# Patient Record
Sex: Male | Born: 1960
Health system: Southern US, Community
[De-identification: ages and names within clinical notes are randomized; demographics above are authoritative.]

## PROBLEM LIST (undated history)

## (undated) DIAGNOSIS — D649 Anemia, unspecified: Secondary | ICD-10-CM

## (undated) DIAGNOSIS — I639 Cerebral infarction, unspecified: Secondary | ICD-10-CM

## (undated) DIAGNOSIS — E785 Hyperlipidemia, unspecified: Secondary | ICD-10-CM

## (undated) DIAGNOSIS — I10 Essential (primary) hypertension: Secondary | ICD-10-CM

## (undated) HISTORY — DX: Essential (primary) hypertension: I10

## (undated) HISTORY — DX: Hyperlipidemia, unspecified: E78.5

## (undated) HISTORY — DX: Cerebral infarction, unspecified: I63.9

## (undated) HISTORY — PX: TUMOR EXCISION: SHX421

## (undated) HISTORY — DX: Anemia, unspecified: D64.9

---

## 2008-12-05 ENCOUNTER — Emergency Department (HOSPITAL_COMMUNITY): Admission: EM | Admit: 2008-12-05 | Discharge: 2008-12-05 | Payer: Self-pay | Admitting: Emergency Medicine

## 2013-07-08 ENCOUNTER — Ambulatory Visit: Payer: Self-pay

## 2013-08-04 ENCOUNTER — Ambulatory Visit: Payer: Self-pay | Admitting: Internal Medicine

## 2014-02-22 ENCOUNTER — Emergency Department (HOSPITAL_COMMUNITY)
Admission: EM | Admit: 2014-02-22 | Discharge: 2014-02-22 | Disposition: A | Discharge status: Home / Self Care | Payer: Self-pay | Attending: Emergency Medicine | Admitting: Emergency Medicine

## 2014-02-22 ENCOUNTER — Encounter (HOSPITAL_COMMUNITY): Payer: Self-pay | Admitting: Emergency Medicine

## 2014-02-22 ENCOUNTER — Emergency Department (HOSPITAL_COMMUNITY): Payer: Self-pay

## 2014-02-22 DIAGNOSIS — Y92009 Unspecified place in unspecified non-institutional (private) residence as the place of occurrence of the external cause: Secondary | ICD-10-CM | POA: Insufficient documentation

## 2014-02-22 DIAGNOSIS — L02511 Cutaneous abscess of right hand: Secondary | ICD-10-CM

## 2014-02-22 DIAGNOSIS — Y9389 Activity, other specified: Secondary | ICD-10-CM | POA: Insufficient documentation

## 2014-02-22 DIAGNOSIS — Z23 Encounter for immunization: Secondary | ICD-10-CM | POA: Insufficient documentation

## 2014-02-22 DIAGNOSIS — L02519 Cutaneous abscess of unspecified hand: Secondary | ICD-10-CM | POA: Insufficient documentation

## 2014-02-22 DIAGNOSIS — S8990XA Unspecified injury of unspecified lower leg, initial encounter: Secondary | ICD-10-CM | POA: Insufficient documentation

## 2014-02-22 DIAGNOSIS — S91309A Unspecified open wound, unspecified foot, initial encounter: Secondary | ICD-10-CM | POA: Insufficient documentation

## 2014-02-22 DIAGNOSIS — S99919A Unspecified injury of unspecified ankle, initial encounter: Secondary | ICD-10-CM

## 2014-02-22 DIAGNOSIS — L03119 Cellulitis of unspecified part of limb: Secondary | ICD-10-CM

## 2014-02-22 DIAGNOSIS — S99929A Unspecified injury of unspecified foot, initial encounter: Secondary | ICD-10-CM

## 2014-02-22 DIAGNOSIS — S91332A Puncture wound without foreign body, left foot, initial encounter: Secondary | ICD-10-CM

## 2014-02-22 DIAGNOSIS — W268XXA Contact with other sharp object(s), not elsewhere classified, initial encounter: Secondary | ICD-10-CM | POA: Insufficient documentation

## 2014-02-22 DIAGNOSIS — Z792 Long term (current) use of antibiotics: Secondary | ICD-10-CM | POA: Insufficient documentation

## 2014-02-22 MED ORDER — DOXYCYCLINE HYCLATE 100 MG PO CAPS: 100.0000 mg | ORAL_CAPSULE | Freq: Two times a day (BID) | ORAL | Status: DC

## 2014-02-22 MED ORDER — TETANUS-DIPHTH-ACELL PERTUSSIS 5-2.5-18.5 LF-MCG/0.5 IM SUSP
0.5000 mL | Freq: Once | INTRAMUSCULAR | Status: AC
  Administered 2014-02-22: 0.5 mL via INTRAMUSCULAR
  Filled 2014-02-22: qty 0.5

## 2014-02-22 MED ORDER — CIPROFLOXACIN HCL 500 MG PO TABS
500.0000 mg | ORAL_TABLET | Freq: Once | ORAL | Status: AC
  Administered 2014-02-22: 500 mg via ORAL
  Filled 2014-02-22: qty 1

## 2014-02-22 MED ORDER — CIPROFLOXACIN HCL 500 MG PO TABS: 500.0000 mg | ORAL_TABLET | Freq: Two times a day (BID) | ORAL | Status: DC

## 2014-02-22 MED ORDER — SULFAMETHOXAZOLE-TRIMETHOPRIM 800-160 MG PO TABS: 1.0000 | ORAL_TABLET | Freq: Two times a day (BID) | ORAL | Status: DC

## 2014-02-22 MED ORDER — HYDROCODONE-ACETAMINOPHEN 5-325 MG PO TABS: ORAL_TABLET | ORAL | Status: DC

## 2014-02-22 MED ORDER — LIDOCAINE HCL 1 % IJ SOLN
5.0000 mL | Freq: Once | INTRAMUSCULAR | Status: DC
  Filled 2014-02-22: qty 20

## 2014-02-22 MED ORDER — LIDOCAINE HCL 1 % IJ SOLN
INTRAMUSCULAR | Status: AC
  Administered 2014-02-22: 20 mL
  Filled 2014-02-22: qty 20

## 2014-02-22 MED ORDER — CEPHALEXIN 500 MG PO CAPS: 500.0000 mg | ORAL_CAPSULE | Freq: Four times a day (QID) | ORAL | Status: DC

## 2014-02-22 MED ORDER — CEPHALEXIN 500 MG PO CAPS
500.0000 mg | ORAL_CAPSULE | Freq: Once | ORAL | Status: AC
  Administered 2014-02-22: 500 mg via ORAL
  Filled 2014-02-22: qty 1

## 2014-02-22 MED ORDER — SULFAMETHOXAZOLE-TMP DS 800-160 MG PO TABS
1.0000 | ORAL_TABLET | Freq: Once | ORAL | Status: AC
  Administered 2014-02-22: 1 via ORAL
  Filled 2014-02-22: qty 1

## 2014-02-22 MED ORDER — CEFTRIAXONE SODIUM 1 G IJ SOLR
2.0000 g | Freq: Once | INTRAMUSCULAR | Status: AC
  Administered 2014-02-22: 2 g via INTRAMUSCULAR
  Filled 2014-02-22: qty 20

## 2014-02-22 NOTE — Consult Note (Signed)
Reason for Consult infection right hand, puncture wound left foot Referring Physician: ER staff  Drew Waller is an 53 y.o. male.  HPI: 53 year old male with an infection in his right hand x4 days with the rule and discharge. He also has a today he was running across a room stepped on a large metal spikes which went up through his tennis shoe and embedded in his foot. He brought to seeing. Is actually the we'll piece off of a child's plate truck. This went proximally and instituting choosing to his foot was very difficult to move out of the foot.  Thus he has 2 distinct problems. He is been given a tetanus shot today.  I discussed all issues and care plans with the emergency room staff.  The PA was going to just give him Cipro for the foot. However this a small puncture wound with high incidence of infection and I informed the patient that I would recommend I&D. I discussed with the patient these issues.  At present time he presents for I&D of his right hand and I&D of his left foot.  I have asked that he received 2 g of Rocephin.    History reviewed. No pertinent past medical history.  Past Surgical History  Procedure Laterality Date  . Tumor excision      No family history on file.  Social History:  reports that he has never smoked. He does not have any smokeless tobacco history on file. He reports that he drinks alcohol. He reports that he does not use illicit drugs.  Allergies: No Known Allergies  Medications: I have reviewed the patient's current medications.  No results found for this or any previous visit (from the past 48 hour(s)).  Dg Foot Complete Left  02/22/2014   CLINICAL DATA:  Stepped on the toy with left foot with pain dorsal side left foot at first metatarsal base  EXAM: LEFT FOOT - COMPLETE 3+ VIEW  COMPARISON:  None.  FINDINGS: Very mild arthritic change at the first metatarsophalangeal joint. No fracture or dislocation. Minimal heel spur.  IMPRESSION: No  acute findings   Electronically Signed   By: Drew Waller Waller.D.   On: 02/22/2014 19:40    Review of Systems  HENT: Negative.   Eyes: Negative.   Respiratory: Negative.   Cardiovascular: Negative.   Gastrointestinal: Negative.   Genitourinary: Negative.   Neurological: Negative.   Endo/Heme/Allergies: Negative.   Psychiatric/Behavioral: Negative.    Blood pressure 151/98, pulse 100, temperature 98.9 F (37.2 C), temperature source Oral, resp. rate 18, height  (1.676 Waller), weight 83.915 kg (185 lb), SpO2 99.00%. Physical Exam Open area about MP joint right hand without bony abnormality in x-ray. He has evidence of infection and ascending erythema. No signs of compartment syndrome. No dystrophy.  The remaining fingers are normal.  Opposite extremity is neurovascular intact  Left foot has a puncture wound over the palmar aspect. This the plantar region and just proximal to the MTP joints.  He can we'll his toes. He is quite tender. He notes no locking popping or catching  Chest is clear.  HEENT stay normal limits  Heart regular rate  Pelvis is stable.  Abdomen is nontender nondistended.    Patient notes no prior injury to this area. Assessment/Plan: #1 infection right hand with abscess of the MCP region left small finger  #2 puncture wound left foot with metallic object to a tennis shoe. Recommend I&D.  The patient was prepped and draped in a  Weber consent and underwent a intermetacarpal/field block. Following this he underwent irrigation and debridement of a deep abscess about the right hand. Irrigation debridement excisional nature was accomplished with multiple rounds of saline placed through and through the wound.  The patient tolerated this well no comp dating features.  Patient was stable following the procedure. Time and place him on doxycycline for presumptive MRSA infection. There is no obvious bite trauma or other issues but is formed over the course of  last 4-5 days.  I did recommend Rocephin.  Procedure #2: Patient underwent to Betadine scrubs and alcohol preparation of the foot followed by him I&D and this was predicated by a lock in the foot. I used no 2% lidocaine without Nephrocaps I feel block he tolerated this well following this I opened the track up and performed I&D of a puncture wound in the foot. His skin subcutaneous tissue underwent irrigation and debridement progressive in nature and I then packed this with iodoform gauze. Following this he was placed in a postop shoe.  We have placed him on Cipro for presumptive Pseudomonas species which often lined a tennis shoe.  He does seem in the office tomorrow 3 PM for followup  Elevate both the left foot and right hand notify me should any problems occur out of work until further notice.  I discussed all issues with the emergency room staff  He was stable time discharge.  I spent and 60 minutes with patient face-to-face time  Drew Waller 02/22/2014, 9:29 PM

## 2014-02-22 NOTE — Discharge Instructions (Signed)
Take your antibiotics as directed and to completion. You should never have any leftover antibiotics! Push fluids and stay well hydrated.   If you see signs of infection (warmth, redness, tenderness, pus, sharp increase in pain, fever, red streaking) immediately return to the emergency department.   Do not hesitate to return to the emergency room for any new, worsening or concerning symptoms.  Please obtain primary care using resource guide below. But the minute you were seen in the emergency room and that they will need to obtain records for further outpatient management.   Abscess Care After An abscess (also called a boil or furuncle) is an infected area that contains a collection of pus. Signs and symptoms of an abscess include pain, tenderness, redness, or hardness, or you may feel a moveable soft area under your skin. An abscess can occur anywhere in the body. The infection may spread to surrounding tissues causing cellulitis. A cut (incision) by the surgeon was made over your abscess and the pus was drained out. Gauze may have been packed into the space to provide a drain that will allow the cavity to heal from the inside outwards. The boil may be painful for 5 to 7 days. Most people with a boil do not have high fevers. Your abscess, if seen early, may not have localized, and may not have been lanced. If not, another appointment may be required for this if it does not get better on its own or with medications. HOME CARE INSTRUCTIONS   Only take over-the-counter or prescription medicines for pain, discomfort, or fever as directed by your caregiver.  When you bathe, soak and then remove gauze or iodoform packs at least daily or as directed by your caregiver. You may then wash the wound gently with mild soapy water. Repack with gauze or do as your caregiver directs. SEEK IMMEDIATE MEDICAL CARE IF:   You develop increased pain, swelling, redness, drainage, or bleeding in the wound site.  You  develop signs of generalized infection including muscle aches, chills, fever, or a general ill feeling.  An oral temperature above 102 F (38.9 C) develops, not controlled by medication. See your caregiver for a recheck if you develop any of the symptoms described above. If medications (antibiotics) were prescribed, take them as directed. Document Released: 12/08/2004 Document Revised: 08/14/2011 Document Reviewed: 08/05/2007 [facility] Patient Information 2015 [place], [place]. This information is not intended to replace advice given to you by your health care provider. Make sure you discuss any questions you have with your health care provider.    Emergency Department Resource Guide 1) Find a Doctor and Pay Out of Pocket Although you won't have to find out who is covered by your insurance plan, it is a good idea to ask around and get recommendations. You will then need to call the office and see if the doctor you have chosen will accept you as a new patient and what types of options they offer for patients who are self-pay. Some doctors offer discounts or will set up payment plans for their patients who do not have insurance, but you will need to ask so you aren't surprised when you get to your appointment.  2) Contact Your Local Health Department Not all health departments have doctors that can see patients for sick visits, but many do, so it is worth a call to see if yours does. If you don't know where your local health department is, you can check in your phone book. The CDC also  has a tool to help you locate your state's health department, and many state websites also have listings of all of their local health departments.  3) Find a Walk-in Clinic If your illness is not likely to be very severe or complicated, you may want to try a walk in clinic. These are popping up all over the country in pharmacies, drugstores, and shopping centers. They're usually staffed by nurse practitioners or  physician assistants that have been trained to treat common illnesses and complaints. They're usually fairly quick and inexpensive. However, if you have serious medical issues or chronic medical problems, these are probably not your best option.  No Primary Care Doctor: - Call Health Connect at  [phone] - they can help you locate a primary care doctor that  accepts your insurance, provides certain services, etc. - Physician Referral Service- [phone]  Chronic Pain Problems: Organization         Address  Phone   Notes  [facility]  [phone] Patients need to be referred by their primary care doctor.   Medication Assistance: Organization         Address  Phone   Notes  [facility] Medication [facility] [address] [place] [place], [place] [address] [phone] --Must be a resident of [facility] -- Must have NO insurance coverage whatsoever (no Medicaid/ Medicare, etc.) -- The pt. MUST have a primary care doctor that directs their care regularly and follows them in the community   [facility]ist  [phone]   [employer]  [phone]    Agencies that provide inexpensive medical care: Organization         Address  Phone   Notes  [facility]  [phone]   [facility]    [phone]   [facility] [address] [place], [place] [address] [phone]   [facility] [address] [place] [place] [phone]   Planned Parenthood    [phone]   [facility]    [phone]   [facility]  [address], [place] Phone:  [phone], Fax:  [phone] Hours of Operation:  9 am - 6 pm, M-F.  Also accepts Medicaid/Medicare and self-pay.  [facility]  [address], [place] Phone: [phone], Fax: [phone]. Hours of Operation:  8:30 am - 5:30 pm, M-F.   Also accepts Medicaid and self-pay.  [facility] [place] [address] [place] Phone: [phone]   [facility] [address] Natasha Bence [place], [place] [phone], Ext. [phone] Mondays & Thursdays: 7-9 AM.  First 15 patients are seen on a first come, first serve basis.    Medicaid-accepting [facility] Providers:  Organization         Address  Phone   Notes  [facility] [address], [place] [phone] Also accepts self-pay patients.  [facility] [address] Laurell Josephs [place], [place]  [phone]   [facility] [address], [place] [phone]   [facility] [address] [place] [phone]   Renaye Rakers [address], [place]   [phone] Only accepts [place] Access [place] patients after they have their name applied to their card.   Self-Pay (no insurance) in  [facility]:  Organization         Address  Phone   Notes  Sickle Cell Patients, [facility] [address] [place], [place] [phone]   [facility] [address] [place], [place] [phone]   [facility] [place]  [address], [place] [phone]   [facility]/Dr. Osei-Bonsu  [address] [place] or [address], [place] [phone] Phone number for both [place] and [place] locations is the same.  [facility] [address], [place] [phone]   [facility] [address] [place] or [address] [phone]   [facility] [address] [place] [phone], phone; [phone], fax Sees patients 1st and 3rd Saturday of every month.  Must not qualify for public or private insurance (i.e. Medicaid, Medicare, [place] Health Choice, Veterans'  Benefits)  Household income should be no more than 200% of the poverty level The clinic cannot treat you if you are pregnant or think you are pregnant  Sexually transmitted diseases are not treated at the clinic.    Dental Care: Organization         Address  Phone  Notes  [facility] Department of [facility] [address] [place], [place] [phone] Accepts children up to age 2 who are enrolled in [place] or [place] Health Choice; pregnant women with a Medicaid card; and children who have applied for Medicaid or [place] Health Choice, but were declined, whose parents can pay a reduced fee at time of service.  [facility] Department of [facility]  [address], [place] [phone] Accepts children up to age 60 who are enrolled in [place] or [place] Health Choice; pregnant women with a Medicaid card; and children who have applied for Medicaid or [place] Health Choice, but were declined, whose parents can pay a reduced fee at time of service.  [facility]  [address] [place], [place] [phone] Patients are seen by appointment only. Walk-ins are not accepted. [facility] will see patients 77 years of age and older. Monday - Tuesday (8am-5pm) Most Wednesdays (8:30-5pm) $30 per visit, cash only  [facility] Adult Dental Access PROGRAM  [address], [facility] [phone] Patients are seen by appointment only. Walk-ins are not accepted. [facility] will see patients 55 years of age and older. One Wednesday Evening (Monthly: Volunteer Based).  $30 per visit, cash only  [employer]  [phone] for adults; Children under age 78, call Graduate Pediatric Dentistry at [phone]. Children aged 22-14, please call [phone] to request a pediatric application.  Dental services are provided in all areas of dental care including fillings, crowns and bridges, complete and partial  dentures, implants, gum treatment, root canals, and extractions. Preventive care is also provided. Treatment is provided to both adults and children. Patients are selected via a lottery and there is often a waiting list.   [facility] [address] [place]  [phone] [email or web address]   [facility] [address] [place], [place] [phone], Ext. [phone] Second and Fourth Thursday of each month, opens at 6:30 AM; Clinic ends at 9 AM.  Patients are seen on a first-come first-served basis, and a limited number are seen during each  clinic.   [facility]  [address] Ether Griffins [place], [place] [phone]   Eligibility Requirements You must have lived in [place], [place], or [place] counties for at least the last three months.   You cannot be eligible for state or federal sponsored [place], including [employer], [place], or [employer].   You generally cannot be eligible for healthcare insurance through your employer.    How to apply: Eligibility screenings are held every Tuesday and Wednesday afternoon from 1:00 pm until 4:00 pm. You do not need an appointment for the interview!  [facility] [address] [place], [place] [phone]   [facility] Health Department  [phone]   [facility] Health Department  [phone]   [facility] Health Department  [phone]    Behavioral Health Resources in the Community: Intensive Outpatient Programs Organization         Address  Phone  Notes  [facility] [address] [place], [place] [phone]   [facility] Outpatient [address] [place], [place] [phone]   [facility] [address], [place], [place]  [phone]   [facility] Mental Health [address],  [place], [place] [phone] or [phone]   Substance Abuse Resources Organization          Address  Phone  Notes  [facility] Drug Services  [phone]   [facility]  [phone]   [facility] [place]  [phone]   Floydene Flock  [phone]   Residential & Outpatient Substance Abuse Program  [phone]   Psychological Services Organization         Address  Phone  Notes  [facility] Behavioral [facility]  [phone]   [facility] Services  [phone]   [facility] Mental Health [address] [place] [phone] or [phone]    Mobile Crisis Teams Organization         Address  Phone  Notes  [facility], Mobile Crisis Care Unit  [phone]   [facility]  [address] [place], [place] [phone]   Doristine Locks [address] [place] [phone]    Self-Help/Support Groups Organization         Address  Phone             Notes  [facility] of [place] - variety of support groups  336- [phone] Call for more information  Narcotics Anonymous (NA), [facility] [address], [employer] [place]  2 meetings at this location   Statistician         Address  Phone  Notes  [facility] [address],    [place]  [phone]   [facility]  [address] [place] [address], [place], [place] [phone]   [facility] [address] [place], [place] [phone] Admissions: 8am-3pm M-F  [facility] [address],    [place], [place] [phone]   [facility] [address] Starling Manns [place], [place] [phone]   [facility] [address]  [place], [place] [phone]   [facility] - Intensive Outpatient [address] Laurell Josephs [address], [place], [place] [phone]   [facility] ([facility]iction [facility] [address] [place]  [place], [place] [phone] or [phone]   [facility] ([facility]) [address], [place], [place]  [phone] Accepts Medicaid  [facility] [place] [address],  [place]  [phone] Substance Abuse/Addiction Treatment   [facility] Organization         Address  Phone  Notes  [facility]  [phone]   Domenic Schwab, PhD [address] Arlis Porta [place], [place]   [phone] or [phone]   [place], [place] [phone]   [address], [place], [place] [phone] Insurance/Medicaid/sponsorship through [facility] and Families [address]                                    [place], [place] [phone] [place] [address], [place] [phone]    Dr. Adele Schilder  [phone]   [facility] Dept. 1) [address] [place] 2) [place] 3)  [place] [place] [address], [place] [phone]  [phone]   [place] [phone] or [phone] (After Hours)

## 2014-02-22 NOTE — ED Provider Notes (Signed)
Dr. Roxanne Mins in to see the pt. Stated he opted to I&D the puncture wound while he was in for I&D of hand. Switched outpt abx to doxycycline and cipro instead of the prior rx of bactrim/keflex/cipro given by Wynetta Emery, PA-C. Wants to give rocephin 2g IM here prior to d/c. Also will have him see him in the office tomorrow at 3pm. Pt stable at this time, and care complete. Did not formally see this pt as he was not originally a patient that required further eval at shift change, I assisted in changing outpatient instructions for [place].  Meds ordered this encounter  Medications  . Tdap (BOOSTRIX) injection 0.5 mL    Sig:   . lidocaine (XYLOCAINE) 1 % (with pres) injection 5 mL    Sig:   . ciprofloxacin (CIPRO) tablet 500 mg    Sig:   . cephALEXin (KEFLEX) capsule 500 mg    Sig:   . sulfamethoxazole-trimethoprim (BACTRIM DS) 800-160 MG per tablet 1 tablet    Sig:   . DISCONTD: sulfamethoxazole-trimethoprim (SEPTRA DS) 800-160 MG per tablet    Sig: Take 1 tablet by mouth every 12 (twelve) hours.    Dispense:  14 tablet    Refill:  0    Order Specific Question:  Supervising Provider    Answer:  Eber Hong D [[id number]]  . DISCONTD: cephALEXin (KEFLEX) 500 MG capsule    Sig: Take 1 capsule (500 mg total) by mouth 4 (four) times daily.    Dispense:  20 capsule    Refill:  0    Order Specific Question:  Supervising Provider    Answer:  Eber Hong D [[id number]]  . ciprofloxacin (CIPRO) 500 MG tablet    Sig: Take 1 tablet (500 mg total) by mouth 2 (two) times daily.    Dispense:  14 tablet    Refill:  0    Order Specific Question:  Supervising Provider    Answer:  Eber Hong D [[id number]]  . HYDROcodone-acetaminophen (NORCO/VICODIN) 5-325 MG per tablet    Sig: Take 1-2 tablets by mouth every 6 hours as needed for pain.    Dispense:  15 tablet    Refill:  0    Order Specific Question:  Supervising Provider    Answer:  Eber Hong D [[id number]]  . doxycycline (VIBRAMYCIN) 100 MG capsule   Sig: Take 1 capsule (100 mg total) by mouth 2 (two) times daily. One po bid x 7 days    Dispense:  14 capsule    Refill:  0    Order Specific Question:  Supervising Provider    Answer:  Eber Hong D [[id number]]  . cefTRIAXone (ROCEPHIN) injection 2 g    Sig:     Order Specific Question:  Antibiotic Indication:    Answer:  Cellulitis    BP 151/98  Pulse 100  Temp(Src) 98.9 F (37.2 C) (Oral)  Resp 18  Ht  (1.676 m)  Wt 185 lb (83.915 kg)  BMI 29.87 kg/m2  SpO2 99%  [place], [address]ot Hinks, PA-C   [place], [place] 02/22/14 2110

## 2014-02-22 NOTE — ED Notes (Signed)
Patient states that he has a spider bite on his right 5th finger that is swelling and now his entire hand is swelling. Also patient states he stepped on a piece of metal that went through his shoe and into his foot 3".

## 2014-02-22 NOTE — ED Provider Notes (Signed)
Medical screening examination/treatment/procedure(s) were conducted as a shared visit with non-physician practitioner(s) and myself.  I personally evaluated the patient during the encounter.   EKG Interpretation None      53 yo male with complaint of puncture wound to his left foot and swelling/drainage to right little finger.  On exam, well appearing, nontoxic, not distressed, normal respiratory effort, normal perfusion, small puncture wound to left foot plantar surface without bleeding or significant TTP.  Right little finger with mild swelling over his proximal phalange with a small punctate area without active drainage.  Hand surgery consulted and has rec'd I&D.    Clinical Impression: 1. Puncture wound of left foot, initial encounter   2. Abscess of right hand       Candyce Churn III, MD 02/22/14 [phone]

## 2014-02-22 NOTE — ED Provider Notes (Signed)
Medical screening examination/treatment/procedure(s) were conducted as a shared visit with non-physician practitioner(s) and myself.  I personally evaluated the patient during the encounter.   EKG Interpretation None        Candyce Churn III, MD 02/22/14 [phone]

## 2014-02-22 NOTE — ED Provider Notes (Signed)
CSN: [id number]     Arrival date & time 02/22/14  1829 History  This chart was scribed for a non-physician practitioner, Wynetta Emery, PA-C working with Merrie Roof, MD by [place] Peace, ED Scribe. The patient was seen in [facility]. The patient's care was started at 6:55 PM.    Chief Complaint  Patient presents with  . Foot Injury  . Insect Bite      Patient is a 53 y.o. male presenting with foot injury. The history is provided by the patient. No language interpreter was used.  Foot Injury Associated symptoms: no fever    HPI Comments: Drew Waller is a 53 y.o. male who presents to the Emergency Department complaining of wound to left foot onset earlier today that occurred while pt was out in the yard and stepped on a piece of metal that punctured this sole of his shoe and went into the plantar aspect of his foot. He reports bleeding was controlled. He denies being in any pain currently. Pt is non-smoker.   Pt is also complaining of insect bite to base of right 5th finger onset 4 days ago while he believes he was sleeping. Pt states that when he woke up the next morning with his hand swollen around the affected area. He notes some drainage as well. Pt is not able to remember when his last tetanus shot was and elects to receive one today.  Pt denies fever, chills, nausea or vomiting. Pt further denies history of DM or steroid use.    History reviewed. No pertinent past medical history. Past Surgical History  Procedure Laterality Date  . Tumor excision     No family history on file. History  Substance Use Topics  . Smoking status: Never Smoker   . Smokeless tobacco: Not on file  . Alcohol Use: Yes     Comment: occ    Review of Systems  Constitutional: Negative for fever and chills.  Gastrointestinal: Negative for nausea and vomiting.  Skin: Positive for wound (Plantar aspect of left foot. ).       Insect bite to right 5th finger with associated swelling and  drainage.   A complete 10 system review of systems was obtained and all systems are negative except as noted in the HPI and PMH.      Allergies  Review of patient's allergies indicates no known allergies.  Home Medications   Prior to Admission medications   Medication Sig Start Date End Date Taking? Authorizing Provider  ciprofloxacin (CIPRO) 500 MG tablet Take 1 tablet (500 mg total) by mouth 2 (two) times daily. 02/22/14   Kirbie Stodghill, PA-C  doxycycline (VIBRAMYCIN) 100 MG capsule Take 1 capsule (100 mg total) by mouth 2 (two) times daily. One po bid x 7 days 02/22/14   Donnita Falls Camprubi-Soms, PA-C  HYDROcodone-acetaminophen (NORCO/VICODIN) 5-325 MG per tablet Take 1-2 tablets by mouth every 6 hours as needed for pain. 02/22/14   Brookes Craine, PA-C   BP 151/98  Pulse 100  Temp(Src) 98.9 F (37.2 C) (Oral)  Resp 18  Ht  (1.676 m)  Wt 185 lb (83.915 kg)  BMI 29.87 kg/m2  SpO2 99% Physical Exam  Nursing note and vitals reviewed. Constitutional: He is oriented to person, place, and time. He appears well-developed and well-nourished. No distress.  HENT:  Head: Normocephalic.  Eyes: Conjunctivae and EOM are normal. Pupils are equal, round, and reactive to light.  Cardiovascular: Normal rate.   Pulmonary/Chest: Effort normal. No stridor.  Musculoskeletal: Normal range of motion.  Neurological: He is alert and oriented to person, place, and time.  Skin:  Abscess actively draining purulent material to dorsum of right fifth MTP. There is a surrounding cellulitis that spares the finger but in calls to dorsum of the hand, it does not extend past the wrist.  There is a puncture wound to the sole of the left foot. It is not pass through to dorsum.  Psychiatric: He has a normal mood and affect.      Item that punctured the foot   ED Course  Procedures (including critical care time) Labs Review Labs Reviewed  CULTURE, ROUTINE-ABSCESS    Results for orders  placed during the hospital encounter of 02/22/14  CULTURE, ROUTINE-ABSCESS      Result Value Ref Range   Specimen Description HAND     Special Requests Normal     Gram Stain PENDING     Culture       Value: NO GROWTH     Performed at [facility]   Report Status PENDING     No results found.    Imaging Review Dg Foot Complete Left  02/22/2014   CLINICAL DATA:  Stepped on the toy with left foot with pain dorsal side left foot at first metatarsal base  EXAM: LEFT FOOT - COMPLETE 3+ VIEW  COMPARISON:  None.  FINDINGS: Very mild arthritic change at the first metatarsophalangeal joint. No fracture or dislocation. Minimal heel spur.  IMPRESSION: No acute findings   Electronically Signed   By: Esperanza Heir M.D.   On: 02/22/2014 19:40     EKG Interpretation None     Medications  Tdap (BOOSTRIX) injection 0.5 mL (0.5 mLs Intramuscular Given 02/22/14 1911)  ciprofloxacin (CIPRO) tablet 500 mg (500 mg Oral Given 02/22/14 1927)  cephALEXin (KEFLEX) capsule 500 mg (500 mg Oral Given 02/22/14 1927)  sulfamethoxazole-trimethoprim (BACTRIM DS) 800-160 MG per tablet 1 tablet (1 tablet Oral Given 02/22/14 1950)  cefTRIAXone (ROCEPHIN) injection 2 g (2 g Intramuscular Given 02/22/14 2124)  lidocaine (XYLOCAINE) 1 % (with pres) injection (20 mLs  Given 02/22/14 2124)    7:00 PM- Treatment plan was discussed with patient who verbalizes understanding and agrees.   MDM   Final diagnoses:  Puncture wound of left foot, initial encounter  Abscess of right hand    Filed Vitals:   02/22/14 1844  BP: 151/98  Pulse: 100  Temp: 98.9 F (37.2 C)  TempSrc: Oral  Resp: 18  Height:  (1.676 m)  Weight: 185 lb (83.915 kg)  SpO2: 99%    Medications  Tdap (BOOSTRIX) injection 0.5 mL (0.5 mLs Intramuscular Given 02/22/14 1911)  ciprofloxacin (CIPRO) tablet 500 mg (500 mg Oral Given 02/22/14 1927)  cephALEXin (KEFLEX) capsule 500 mg (500 mg Oral Given 02/22/14 1927)   sulfamethoxazole-trimethoprim (BACTRIM DS) 800-160 MG per tablet 1 tablet (1 tablet Oral Given 02/22/14 1950)  cefTRIAXone (ROCEPHIN) injection 2 g (2 g Intramuscular Given 02/22/14 2124)  lidocaine (XYLOCAINE) 1 % (with pres) injection (20 mLs  Given 02/22/14 2124)    Drew Waller is a 53 y.o. male presenting with abscess and cellulitis to right (dominant) hand. Patient also has puncture wound to dorsum of left foot. Went through the sole of his sneakers. We'll cover for Pseudomonas with Cipro. Declines pain medication at this time. No signs of systemic infection.   Hand surgery consult from Dr. Butler Denmark appreciated: he will come to the ED to I and D the wound  and Evaluate the patient. Patient will be started on doxycycline for the hand cellulitis and Cipro for the foot puncture wound. Extensive discussion of return precautions.  This is a shared visit with the attending physician who personally evaluated the patient and agrees with the care plan.   Evaluation does not show pathology that would require ongoing emergent intervention or inpatient treatment. Pt is hemodynamically stable and mentating appropriately. Discussed findings and plan with patient/guardian, who agrees with care plan. All questions answered. Return precautions discussed and outpatient follow up given.   Discharge Medication List as of 02/22/2014  9:07 PM    START taking these medications   Details  ciprofloxacin (CIPRO) 500 MG tablet Take 1 tablet (500 mg total) by mouth 2 (two) times daily., Starting 02/22/2014, Until Discontinued, Print    doxycycline (VIBRAMYCIN) 100 MG capsule Take 1 capsule (100 mg total) by mouth 2 (two) times daily. One po bid x 7 days, Starting 02/22/2014, Until Discontinued, Print    HYDROcodone-acetaminophen (NORCO/VICODIN) 5-325 MG per tablet Take 1-2 tablets by mouth every 6 hours as needed for pain., Print          I personally performed the services described in this documentation, which  was scribed in my presence. The recorded information has been reviewed and is accurate.    Wynetta Emery, PA-C 02/22/14 1950  Wynetta Emery, PA-C 02/23/14 1654

## 2014-02-25 LAB — CULTURE, ROUTINE-ABSCESS: Special Requests: NORMAL

## 2014-02-25 NOTE — ED Provider Notes (Signed)
Medical screening examination/treatment/procedure(s) were conducted as a shared visit with non-physician practitioner(s) and myself.  I personally evaluated the patient during the encounter.   EKG Interpretation None        Candyce Churn III, MD 02/25/14 [phone]

## 2014-02-26 ENCOUNTER — Telehealth (HOSPITAL_COMMUNITY): Payer: Self-pay

## 2014-02-26 NOTE — ED Notes (Signed)
Post ED Visit - Positive Culture Follow-up  Culture report reviewed by antimicrobial stewardship pharmacist:  Wes Dulaney, Pharm.D., BCPS  Celedonio Miyamoto, Pharm.D., BCPS  Georgina Pillion, Pharm.D., BCPS  [place], [place].D., BCPS, AAHIVP  Estella Husk, Pharm.D., BCPS, AAHIVP  Carly Sabat, Pharm.D.  Enzo Bi, [address].D.  Positive abcess culture Treated with cephalexin, organism sensitive to the same and no further patient follow-up is required at this time.  Ashley Jacobs 02/26/2014, 12:37 PM

## 2015-12-01 ENCOUNTER — Ambulatory Visit: Payer: Self-pay | Admitting: Family

## 2015-12-01 DIAGNOSIS — Z0289 Encounter for other administrative examinations: Secondary | ICD-10-CM

## 2017-05-20 ENCOUNTER — Emergency Department (HOSPITAL_COMMUNITY): Payer: 59

## 2017-05-20 ENCOUNTER — Inpatient Hospital Stay (HOSPITAL_COMMUNITY)
Admission: EM | Admit: 2017-05-20 | Discharge: 2017-05-26 | DRG: 313 | Disposition: A | Discharge status: Home / Self Care | Payer: 59 | Attending: Internal Medicine | Admitting: Internal Medicine

## 2017-05-20 ENCOUNTER — Encounter (HOSPITAL_COMMUNITY): Payer: Self-pay | Admitting: Emergency Medicine

## 2017-05-20 ENCOUNTER — Other Ambulatory Visit: Payer: Self-pay

## 2017-05-20 DIAGNOSIS — R079 Chest pain, unspecified: Secondary | ICD-10-CM

## 2017-05-20 DIAGNOSIS — D729 Disorder of white blood cells, unspecified: Secondary | ICD-10-CM

## 2017-05-20 DIAGNOSIS — I10 Essential (primary) hypertension: Secondary | ICD-10-CM | POA: Diagnosis present

## 2017-05-20 DIAGNOSIS — R072 Precordial pain: Secondary | ICD-10-CM

## 2017-05-20 DIAGNOSIS — D72829 Elevated white blood cell count, unspecified: Secondary | ICD-10-CM | POA: Diagnosis present

## 2017-05-20 DIAGNOSIS — E876 Hypokalemia: Secondary | ICD-10-CM | POA: Diagnosis present

## 2017-05-20 DIAGNOSIS — R0789 Other chest pain: Principal | ICD-10-CM | POA: Diagnosis present

## 2017-05-20 DIAGNOSIS — Z8249 Family history of ischemic heart disease and other diseases of the circulatory system: Secondary | ICD-10-CM

## 2017-05-20 DIAGNOSIS — N289 Disorder of kidney and ureter, unspecified: Secondary | ICD-10-CM

## 2017-05-20 DIAGNOSIS — K047 Periapical abscess without sinus: Secondary | ICD-10-CM

## 2017-05-20 DIAGNOSIS — R918 Other nonspecific abnormal finding of lung field: Secondary | ICD-10-CM | POA: Diagnosis present

## 2017-05-20 DIAGNOSIS — R9431 Abnormal electrocardiogram [ECG] [EKG]: Secondary | ICD-10-CM

## 2017-05-20 DIAGNOSIS — N179 Acute kidney failure, unspecified: Secondary | ICD-10-CM | POA: Diagnosis present

## 2017-05-20 LAB — CBC
HEMATOCRIT: 42.2 % (ref 39.0–52.0)
Hemoglobin: 14.1 g/dL (ref 13.0–17.0)
MCH: 28.5 pg (ref 26.0–34.0)
MCHC: 33.4 g/dL (ref 30.0–36.0)
MCV: 85.4 fL (ref 78.0–100.0)
Platelets: 322 10*3/uL (ref 150–400)
RBC: 4.94 MIL/uL (ref 4.22–5.81)
RDW: 13.9 % (ref 11.5–15.5)
WBC: 16 10*3/uL — ABNORMAL HIGH (ref 4.0–10.5)

## 2017-05-20 LAB — CBC WITH DIFFERENTIAL/PLATELET
Basophils Absolute: 0 10*3/uL (ref 0.0–0.1)
Basophils Relative: 0 %
EOS ABS: 0 10*3/uL (ref 0.0–0.7)
Eosinophils Relative: 0 %
HEMATOCRIT: 41 % (ref 39.0–52.0)
HEMOGLOBIN: 14.1 g/dL (ref 13.0–17.0)
LYMPHS ABS: 2.1 10*3/uL (ref 0.7–4.0)
LYMPHS PCT: 12 %
MCH: 29.3 pg (ref 26.0–34.0)
MCHC: 34.4 g/dL (ref 30.0–36.0)
MCV: 85.1 fL (ref 78.0–100.0)
Monocytes Absolute: 1.9 10*3/uL — ABNORMAL HIGH (ref 0.1–1.0)
Monocytes Relative: 11 %
NEUTROS ABS: 13.5 10*3/uL — AB (ref 1.7–7.7)
NEUTROS PCT: 77 %
Platelets: 321 10*3/uL (ref 150–400)
RBC: 4.82 MIL/uL (ref 4.22–5.81)
RDW: 13.9 % (ref 11.5–15.5)
WBC: 17.6 10*3/uL — AB (ref 4.0–10.5)

## 2017-05-20 LAB — RAPID URINE DRUG SCREEN, HOSP PERFORMED
Amphetamines: NOT DETECTED
BENZODIAZEPINES: NOT DETECTED
Barbiturates: NOT DETECTED
Cocaine: POSITIVE — AB
Opiates: POSITIVE — AB
Tetrahydrocannabinol: POSITIVE — AB

## 2017-05-20 LAB — BASIC METABOLIC PANEL
Anion gap: 9 (ref 5–15)
BUN: 10 mg/dL (ref 6–20)
CHLORIDE: 102 mmol/L (ref 101–111)
CO2: 27 mmol/L (ref 22–32)
Calcium: 9.2 mg/dL (ref 8.9–10.3)
Creatinine, Ser: 1.34 mg/dL — ABNORMAL HIGH (ref 0.61–1.24)
GFR calc Af Amer: >60 mL/min (ref >60)
GFR calc non Af Amer: 59 mL/min — ABNORMAL LOW (ref >60)
Glucose, Bld: 101 mg/dL — ABNORMAL HIGH (ref 65–99)
POTASSIUM: 3.4 mmol/L — AB (ref 3.5–5.1)
Sodium: 138 mmol/L (ref 135–145)

## 2017-05-20 LAB — I-STAT CG4 LACTIC ACID, ED: LACTIC ACID, VENOUS: 0.9 mmol/L (ref 0.5–1.9)

## 2017-05-20 LAB — SEDIMENTATION RATE: SED RATE: 44 mm/h — AB (ref 0–16)

## 2017-05-20 LAB — I-STAT TROPONIN, ED: Troponin i, poc: 0 ng/mL (ref 0.00–0.08)

## 2017-05-20 LAB — TROPONIN I: Troponin I: <0.03 ng/mL (ref <0.03)

## 2017-05-20 MED ORDER — GI COCKTAIL ~~LOC~~
30.0000 mL | Freq: Once | ORAL | Status: AC
  Administered 2017-05-20: 30 mL via ORAL
  Filled 2017-05-20: qty 30

## 2017-05-20 MED ORDER — MORPHINE SULFATE (PF) 4 MG/ML IV SOLN
4.0000 mg | INTRAVENOUS | Status: AC | PRN
  Administered 2017-05-20 – 2017-05-21 (×3): 4 mg via INTRAVENOUS
  Filled 2017-05-20 (×3): qty 1

## 2017-05-20 MED ORDER — POTASSIUM CHLORIDE IN NACL 20-0.9 MEQ/L-% IV SOLN
INTRAVENOUS | Status: AC
  Administered 2017-05-20: 22:00:00 via INTRAVENOUS
  Filled 2017-05-20: qty 1000

## 2017-05-20 MED ORDER — ASPIRIN 81 MG PO CHEW
162.0000 mg | CHEWABLE_TABLET | Freq: Once | ORAL | Status: AC
  Administered 2017-05-20: 162 mg via ORAL
  Filled 2017-05-20: qty 2

## 2017-05-20 MED ORDER — SODIUM CHLORIDE 0.9 % IV BOLUS (SEPSIS)
1000.0000 mL | Freq: Once | INTRAVENOUS | Status: AC
Start: 2017-05-20 — End: 2017-05-20
  Administered 2017-05-20: 1000 mL via INTRAVENOUS

## 2017-05-20 MED ORDER — PIPERACILLIN-TAZOBACTAM 3.375 G IVPB 30 MIN
3.3750 g | Freq: Once | INTRAVENOUS | Status: AC
  Administered 2017-05-20: 3.375 g via INTRAVENOUS
  Filled 2017-05-20: qty 50

## 2017-05-20 MED ORDER — DEXTROSE 5 % IV SOLN
500.0000 mg | INTRAVENOUS | Status: DC
  Administered 2017-05-20 – 2017-05-24 (×5): 500 mg via INTRAVENOUS
  Filled 2017-05-20 (×6): qty 500

## 2017-05-20 MED ORDER — MORPHINE SULFATE (PF) 4 MG/ML IV SOLN
4.0000 mg | Freq: Once | INTRAVENOUS | Status: AC
  Administered 2017-05-20: 4 mg via INTRAVENOUS
  Filled 2017-05-20: qty 1

## 2017-05-20 MED ORDER — IOPAMIDOL (ISOVUE-370) INJECTION 76%
INTRAVENOUS | Status: AC
  Administered 2017-05-20: 100 mL
  Filled 2017-05-20: qty 100

## 2017-05-20 MED ORDER — ENOXAPARIN SODIUM 40 MG/0.4ML ~~LOC~~ SOLN
40.0000 mg | SUBCUTANEOUS | Status: DC
  Administered 2017-05-20 – 2017-05-25 (×6): 40 mg via SUBCUTANEOUS
  Filled 2017-05-20 (×6): qty 0.4

## 2017-05-20 MED ORDER — VANCOMYCIN HCL 10 G IV SOLR
1500.0000 mg | Freq: Once | INTRAVENOUS | Status: AC
  Administered 2017-05-20: 1500 mg via INTRAVENOUS
  Filled 2017-05-20: qty 1500

## 2017-05-20 MED ORDER — PIPERACILLIN-TAZOBACTAM 3.375 G IVPB
3.3750 g | Freq: Three times a day (TID) | INTRAVENOUS | Status: DC
  Administered 2017-05-21 – 2017-05-25 (×14): 3.375 g via INTRAVENOUS
  Filled 2017-05-20 (×14): qty 50

## 2017-05-20 MED ORDER — VANCOMYCIN HCL 10 G IV SOLR
1250.0000 mg | INTRAVENOUS | Status: DC
  Filled 2017-05-20: qty 1250

## 2017-05-20 NOTE — ED Triage Notes (Signed)
Patient c/o central and left sided chest pain for 2 days. Reports that has had cough that is non-productive.  Patient reports that has dental abscess for about a month, and daughter concerned could be related.

## 2017-05-20 NOTE — ED Notes (Signed)
ASSIGNED 1415 @ 1645 CALL REPORT @ 17:05

## 2017-05-20 NOTE — H&P (Addendum)
TRH H&P   Patient Demographics:    Drew Waller, is a 56 y.o. male  MRN: [id number]   DOB - Jul 30, 1960  Admit Date - 05/20/2017  Outpatient Primary MD for the patient is Golden Circle, FNP  Referring MD/NP/PA: Reece Agar  Outpatient Specialists:    Patient coming from: home  Chief Complaint  Patient presents with  . Chest Pain  . Dental Pain      HPI:    Drew Waller  is a 56 y.o. male, w recent dental abscess for the past 1 month. Pt has not yet seen dentist.  Pt states that starting Friday having chest pain "pressure" "tightness" and left sided.  No radiation of the pain. "constant" .  Pt presented today because the pain was not going away.  Denies fever, chills, cough, palp,dyspnea, n/v, diarrhea, brbpr, black stool.    In ED ,    EKG nsr at 97, nl axis. , nl int, slight t inversion in v4-6.  Trop negative 0.00  Na 138, K 3.4, Bun 10, Creatinine 1.34,  Wbc 16.0, hgb 14.1, Plt 322  CTA chest  IMPRESSION: 1. Examination is negative for acute pulmonary emboli. 2. Multiple bilateral pulmonary nodules, at least 1 of which is cavitary in appearance. The appearance is nonspecific. Differential considerations include multiple septic emboli, granulomatous inflammation/infection or metastatic disease. Careful clinical correlation is advised.    Pt will be admitted for ? Septic emboli.     Review of systems:    In addition to the HPI above,  No Fever-     +++chills, No Headache, No changes with Vision or hearing, No problems swallowing food or Liquids, No Cough or Shortness of Breath, No Abdominal pain, No Nausea or Vommitting, Bowel movements are regular, No Blood in stool or Urine, No dysuria, No new skin rashes or bruises, No new joints pains-aches,  No new weakness, tingling, numbness in any extremity, No recent weight gain or loss, No  polyuria, polydypsia or polyphagia, No significant Mental Stressors.  A full 10 point Review of Systems was done, except as stated above, all other Review of Systems were negative.   With Past History of the following :    History reviewed. No pertinent past medical history.    Past Surgical History:  Procedure Laterality Date  . TUMOR EXCISION        Social History:     Social History   Tobacco Use  . Smoking status: Never Smoker  . Smokeless tobacco: Never Used  Substance Use Topics  . Alcohol use: Yes    Alcohol/week: 4.2 oz    Types: 7 Cans of beer per week     Lives - at home w wife  Mobility - walks by self   Family History :     Family History  Problem Relation Age of Onset  . Heart attack Mother   .  Prostate cancer Father       Home Medications:   Prior to Admission medications   Medication Sig Start Date End Date Taking? Authorizing Provider  ciprofloxacin (CIPRO) 500 MG tablet Take 1 tablet (500 mg total) by mouth 2 (two) times daily. Patient not taking: Reported on 05/20/2017 02/22/14   Pisciotta, Elmyra Ricks, PA-C  doxycycline (VIBRAMYCIN) 100 MG capsule Take 1 capsule (100 mg total) by mouth 2 (two) times daily. One po bid x 7 days Patient not taking: Reported on 05/20/2017 02/22/14   Street, [place], PA-C  HYDROcodone-acetaminophen (NORCO/VICODIN) 5-325 MG per tablet Take 1-2 tablets by mouth every 6 hours as needed for pain. Patient not taking: Reported on 05/20/2017 02/22/14   Pisciotta, Elmyra Ricks, PA-C     Allergies:    No Known Allergies   Physical Exam:   Vitals  Blood pressure (!) 146/96, pulse 93, temperature 98.6 F (37 C), temperature source Oral, resp. rate 15, height _0  (1.676 m), weight 81.6 kg (180 lb), SpO2 98 %.   1. General  lying in bed in NAD,    2. Normal affect and insight, Not Suicidal or Homicidal, Awake Alert, Oriented X 3.  3. No F.N deficits, ALL C.Nerves Intact, Strength 5/5 all 4 extremities, Sensation intact  all 4 extremities, Plantars down going.  4. Ears and Eyes appear Normal, Conjunctivae clear, PERRLA. Moist Oral Mucosa.  + dental abscess left lower molar  5. Supple Neck, No JVD, No cervical lymphadenopathy appriciated, No Carotid Bruits.  6. Symmetrical Chest wall movement, Good air movement bilaterally, CTAB.  7. RRR, No Gallops, Rubs or Murmurs, No Parasternal Heave.  8. Positive Bowel Sounds, Abdomen Soft, No tenderness, No organomegaly appriciated,No rebound -guarding or rigidity.  9.  No Cyanosis, Normal Skin Turgor, No Skin Rash or Bruise.  10. Good muscle tone,  joints appear normal , no effusions, Normal ROM.  11. No Palpable Lymph Nodes in Neck or Axillae  No splinter, no janeway, no osler, no conjunctival hemorrhage.    Data Review:    CBC Recent Labs  Lab 05/20/17 1402  WBC 17.6*  16.0*  HGB 14.1  14.1  HCT 41.0  42.2  PLT 321  322  MCV 85.1  85.4  MCH 29.3  28.5  MCHC 34.4  33.4  RDW 13.9  13.9  LYMPHSABS 2.1  MONOABS 1.9*  EOSABS 0.0  BASOSABS 0.0   ------------------------------------------------------------------------------------------------------------------  Chemistries  Recent Labs  Lab 05/20/17 1402  NA 138  K 3.4*  CL 102  CO2 27  GLUCOSE 101*  BUN 10  CREATININE 1.34*  CALCIUM 9.2   ------------------------------------------------------------------------------------------------------------------ estimated creatinine clearance is 63.2 mL/min (A) (by C-G formula based on SCr of 1.34 mg/dL (H)). ------------------------------------------------------------------------------------------------------------------ No results for input(s): TSH, T4TOTAL, T3FREE, THYROIDAB in the last 72 hours.  Invalid input(s): FREET3  Coagulation profile No results for input(s): INR, PROTIME in the last 168 hours. ------------------------------------------------------------------------------------------------------------------- No results for  input(s): DDIMER in the last 72 hours. -------------------------------------------------------------------------------------------------------------------  Cardiac Enzymes No results for input(s): CKMB, TROPONINI, MYOGLOBIN in the last 168 hours.  Invalid input(s): CK ------------------------------------------------------------------------------------------------------------------ No results found for: BNP   ---------------------------------------------------------------------------------------------------------------  Urinalysis No results found for: COLORURINE, APPEARANCEUR, LABSPEC, PHURINE, GLUCOSEU, HGBUR, BILIRUBINUR, KETONESUR, PROTEINUR, UROBILINOGEN, NITRITE, LEUKOCYTESUR  ----------------------------------------------------------------------------------------------------------------   Imaging Results:    Dg Chest 2 View  Result Date: 05/20/2017 CLINICAL DATA:  Chest pain EXAM: CHEST  2 VIEW COMPARISON:  None. FINDINGS: There are nodular densities in both lungs. These range in size from 6-10 mm.  Heart is normal in size. No pneumothorax or pleural effusion. IMPRESSION: Bilateral nodular densities are noted. Pulmonary nodules cannot be excluded. CT chest is recommended. Electronically Signed   By: Marybelle Killings M.D.   On: 05/20/2017 14:45   Ct Angio Chest Pe W And/or Wo Contrast  Result Date: 05/20/2017 CLINICAL DATA:  Evaluate for acute pulmonary embolus. EXAM: CT ANGIOGRAPHY CHEST WITH CONTRAST TECHNIQUE: Multidetector CT imaging of the chest was performed using the standard protocol during bolus administration of intravenous contrast. Multiplanar CT image reconstructions and MIPs were obtained to evaluate the vascular anatomy. CONTRAST:  123m ISOVUE-370 IOPAMIDOL (ISOVUE-370) INJECTION 76% COMPARISON:  Chest radiograph 05/20/2017 FINDINGS: Cardiovascular: Satisfactory opacification of the pulmonary arteries to the segmental level. No evidence of pulmonary embolism. Normal heart  size. No pericardial effusion. Mediastinum/Nodes: No enlarged mediastinal, hilar, or axillary lymph nodes. Thyroid gland, trachea, and esophagus demonstrate no significant findings. Lungs/Pleura: Multiple bilateral pulmonary nodules noted. At least 1 of these has a central cavitary component. Index cavitary nodule in the right upper lobe measures 11 mm, image 53 of series 6. Index solid nodule within the posteromedial subpleural left lower lobe measures 1.5 cm, image 91 of series 6. Solid subpleural nodule in the left upper lobe measures 9 mm, image 61 of series 6. Upper Abdomen: No acute abnormality. 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 No aggressive lytic or sclerotic bone lesions. Review of the MIP images confirms the above findings. IMPRESSION: 1. Examination is negative for acute pulmonary emboli. 2. Multiple bilateral pulmonary nodules, at least 1 of which is cavitary in appearance. The appearance is nonspecific. Differential considerations include multiple septic emboli, granulomatous inflammation/infection or metastatic disease. Careful clinical correlation is advised. Electronically Signed   By: TKerby MoorsM.D.   On: 05/20/2017 16:16      Assessment & Plan:    Principal Problem:   Chest pain Active Problems:   Leukocytosis   Hypokalemia   Renal insufficiency    Chest pain  Tele Trop I q6h x3 Echo Cardiology consult for AM placed by email  Leukocytosis Check ESR Check Echo  Multiple pulmonary nodule and cavitary lung lesion Pulmonary consultation Check ESR TB precautions Sputum gram stain /culture Sputum AFB Vanco iv pharmacy to dose, zosyn iv pharmacy to dose zithromax 5022miv qday  Hypokalemia Replete Check cmp in am  Renal insufficiency Hydrate with Ns iv Check cmp in am  DVT Prophylaxis  Lovenox - SCDs  AM Labs Ordered, also please review Full Orders  Family Communication: Admission, patients condition and plan  of care including tests being ordered have been discussed with the patient who indicate understanding and agree with the plan and Code Status.  Code Status FULL CODE  Likely DC to  home  Condition GUARDED    Consults called: none  Admission status: inpatient   Time spent in minutes : 45   JaJani Gravel.D on 05/20/2017 at 4:59 PM  Between 7am to 7pm - Pager - [phone] After 7pm go to [email or web address] - password [facility] - Office  [phone]

## 2017-05-20 NOTE — ED Provider Notes (Signed)
[facility]-EMERGENCY DEPT Provider Note   CSN: [id number] Arrival date & time: 05/20/17  1313     History   Chief Complaint Chief Complaint  Patient presents with  . Chest Pain  . Dental Pain    HPI Drew Waller is a 56 y.o. male with no known PMHx, who presents to the ED with complaints of gradual onset left-sided and central chest pain that began 2 days ago while he was at work and stressed out.  Patient endorses increased stress recently.  He describes his pain as 7/10 constant pressure-like pain in the left side of his chest and somewhat into the center of his chest, nonradiating, worse with laying down, breathing, and coughing, and unrelieved with 162 mg aspirin and 800mg  ibuprofen.  He had some chills last night as well.  He reports that he has had a mild dry cough for the last few days.  He also mentions that over the last 1 month he has had an abscess and dental pain in the left lower molar, states that it has improved somewhat since onset however continues to have some pain and swelling.  He was worried that this could be related to the chest pain he is experiencing.  +FHx of MI in mother at age 54 and MGM at age 54 (both died of these MIs).  He denies known personal hx of any conditions, including HTN, DM2, HLD, however he admits he hasn't been to a PCP in "20 years", so he's Drew sure if he could possibly have some chronic medical illness that he simply isn't aware of.  He also doesn't have a dentist.  He is a nonsmoker.  He has Drew gone out of the country, been imprisoned, or been around any one who has traveled or been incarcerated. He has Drew been homeless or lived on the streets/shelters recently.   He denies gum drainage, trismus, drooling, diaphoresis, lightheadedness, fevers, SOB, LE swelling, recent travel/surgery/immobilization, personal/family hx of DVT/PE, abd pain, N/V/D/C, hematuria, dysuria, myalgias, arthralgias, claudication, orthopnea,  numbness, tingling, focal weakness, or any other complaints at this time.   The history is provided by the patient and medical records. No language interpreter was used.  Chest Pain   This is a new problem. The current episode started 2 days ago. The problem occurs constantly. The problem has Drew changed since onset.Associated with: stress at work. The pain is present in the substernal region and lateral region. The pain is at a severity of 7/10. The pain is moderate. The quality of the pain is described as pressure-like. The pain does Drew radiate. Duration of episode(s) is 2 days. The symptoms are aggravated by deep breathing and certain positions (laying down, breathing, coughing). Associated symptoms include cough (dry). Pertinent negatives include no abdominal pain, no claudication, no diaphoresis, no fever, no lower extremity edema, no nausea, no numbness, no orthopnea, no shortness of breath, no sputum production, no vomiting and no weakness. Treatments tried: 162mg  ASA and 800mg  ibuprofen. The treatment provided no relief. Risk factors include male gender.  Pertinent negatives for past medical history include no diabetes, no DVT, no hyperlipidemia, no hypertension and no PE.  His family medical history is significant for CAD.  Pertinent negatives for family medical history include: no PE.  Dental Pain      History reviewed. No pertinent past medical history.  There are no active problems to display for this patient.   Past Surgical History:  Procedure Laterality Date  . TUMOR EXCISION  Home Medications    Prior to Admission medications   Medication Sig Start Date End Date Taking? Authorizing Provider  ciprofloxacin (CIPRO) 500 MG tablet Take 1 tablet (500 mg total) by mouth 2 (two) times daily. 02/22/14   Pisciotta, Joni Reining, PA-C  doxycycline (VIBRAMYCIN) 100 MG capsule Take 1 capsule (100 mg total) by mouth 2 (two) times daily. One po bid x 7 days 02/22/14   Natsuko Kelsay,  [place], [place]  HYDROcodone-acetaminophen (NORCO/VICODIN) 5-325 MG per tablet Take 1-2 tablets by mouth every 6 hours as needed for pain. 02/22/14   Pisciotta, Mardella Layman    Family History No family history on file.  Social History Social History   Tobacco Use  . Smoking status: Never Smoker  . Smokeless tobacco: Never Used  Substance Use Topics  . Alcohol use: Yes    Alcohol/week: 4.2 oz    Types: 7 Cans of beer per week  . Drug use: No     Allergies   Patient has no known allergies.   Review of Systems Review of Systems  Constitutional: Positive for chills. Negative for diaphoresis and fever.  HENT: Positive for dental problem. Negative for drooling and trouble swallowing.   Respiratory: Positive for cough (dry). Negative for sputum production and shortness of breath.   Cardiovascular: Positive for chest pain. Negative for orthopnea, claudication and leg swelling.  Gastrointestinal: Negative for abdominal pain, constipation, diarrhea, nausea and vomiting.  Genitourinary: Negative for dysuria and hematuria.  Musculoskeletal: Negative for arthralgias and myalgias.  Skin: Negative for color change.  Allergic/Immunologic: Negative for immunocompromised state.  Neurological: Negative for weakness, light-headedness and numbness.  Psychiatric/Behavioral: Negative for confusion.   All other systems reviewed and are negative for acute change except as noted in the HPI.    Physical Exam Updated Vital Signs BP (!) 146/96 (BP Waller: Left Arm)   Pulse 93   Temp 98.6 F (37 C) (Oral)   Resp 15   Ht 5\' 6"  (1.676 m)   Wt 81.6 kg (180 lb)   SpO2 98%   BMI 29.05 kg/m   Physical Exam  Constitutional: He is oriented to person, place, and time. Vital signs are normal. He appears well-developed and well-nourished.  Non-toxic appearance. No distress.  Afebrile, nontoxic, NAD  HENT:  Head: Normocephalic and atraumatic.  Nose: Nose normal.  Mouth/Throat: Uvula is midline,  oropharynx is Waller and moist and mucous membranes are normal. No trismus in the jaw. Dental abscesses and dental caries present. No uvula swelling.  Decayed L lower molars #17-19 down to the socket, with a small abscess around the socket for teeth #18-19, mildly tender and erythematous, no evidence of ludwig's, handling secretions well, no trismus or drooling.   Eyes: Conjunctivae and EOM are normal. Right eye exhibits no discharge. Left eye exhibits no discharge.  Neck: Normal range of motion. Neck supple.  Cardiovascular: Normal rate, regular rhythm, normal heart sounds and intact distal pulses. Exam reveals no gallop and no friction rub.  No murmur heard. RRR, nl s1/s2, no m/r/g, distal pulses intact, no pedal edema   Pulmonary/Chest: Effort normal and breath sounds normal. No respiratory distress. He has no decreased breath sounds. He has no wheezes. He has no rhonchi. He has no rales. He exhibits tenderness. He exhibits no crepitus, no deformity and no retraction.  CTAB in all lung fields, no w/r/r, no hypoxia or increased WOB, speaking in full sentences, SpO2 98% on RA Chest wall with mild TTP to L anterior aspect, without crepitus, deformities, or  retractions     Abdominal: Soft. Normal appearance and bowel sounds are normal. He exhibits no distension. There is no tenderness. There is no rigidity, no rebound, no guarding, no CVA tenderness, no tenderness at McBurney's point and negative Murphy's sign.  Musculoskeletal: Normal range of motion.  MAE x4 Strength and sensation grossly intact in all extremities Distal pulses intact Gait steady No pedal edema, neg homan's bilaterally   Neurological: He is alert and oriented to person, place, and time. He has normal strength. No sensory deficit.  Skin: Skin is warm, dry and intact. No rash noted.  Psychiatric: He has a normal mood and affect.  Nursing note and vitals reviewed.    ED Treatments / Results  Labs (all labs ordered are  listed, but only abnormal results are displayed) Labs Reviewed  BASIC METABOLIC PANEL - Abnormal; Notable for the following components:      Result Value   Potassium 3.4 (*)    Glucose, Bld 101 (*)    Creatinine, Ser 1.34 (*)    GFR calc non Af Amer 59 (*)    All other components within normal limits  CBC - Abnormal; Notable for the following components:   WBC 16.0 (*)    All other components within normal limits  CBC WITH DIFFERENTIAL/PLATELET - Abnormal; Notable for the following components:   WBC 17.6 (*)    Neutro Abs 13.5 (*)    Monocytes Absolute 1.9 (*)    All other components within normal limits  CULTURE, BLOOD (ROUTINE X 2)  CULTURE, BLOOD (ROUTINE X 2)  I-STAT TROPONIN, ED  I-STAT CG4 LACTIC ACID, ED    EKG  EKG Interpretation  Date/Time:  Sunday May 20 2017 13:30:12 EST Ventricular Rate:  97 PR Interval:    QRS Duration: 82 QT Interval:  326 QTC Calculation: 415 R Axis:   -4 Text Interpretation:  Sinus rhythm Borderline abnrm T, anterolateral leads No previous ECGs available Confirmed by Alvira Monday ([id number]) on 05/20/2017 4:18:19 PM       Radiology Dg Chest 2 View  Result Date: 05/20/2017 CLINICAL DATA:  Chest pain EXAM: CHEST  2 VIEW COMPARISON:  None. FINDINGS: There are nodular densities in both lungs. These range in size from 6-10 mm. Heart is normal in size. No pneumothorax or pleural effusion. IMPRESSION: Bilateral nodular densities are noted. Pulmonary nodules cannot be excluded. CT chest is recommended. Electronically Signed   By: Jolaine Click M.D.   On: 05/20/2017 14:45   Ct Angio Chest Pe W And/or Wo Contrast  Result Date: 05/20/2017 CLINICAL DATA:  Evaluate for acute pulmonary embolus. EXAM: CT ANGIOGRAPHY CHEST WITH CONTRAST TECHNIQUE: Multidetector CT imaging of the chest was performed using the standard protocol during bolus administration of intravenous contrast. Multiplanar CT image reconstructions and MIPs were obtained to evaluate  the vascular anatomy. CONTRAST:  ISOVUE-370 IOPAMIDOL (ISOVUE-370) INJECTION 76% COMPARISON:  Chest radiograph 05/20/2017 FINDINGS: Cardiovascular: Satisfactory opacification of the pulmonary arteries to the segmental level. No evidence of pulmonary embolism. Normal heart size. No pericardial effusion. Mediastinum/Nodes: No enlarged mediastinal, hilar, or axillary lymph nodes. Thyroid gland, trachea, and esophagus demonstrate no significant findings. Lungs/Pleura: Multiple bilateral pulmonary nodules noted. At least 1 of these has a central cavitary component. Index cavitary nodule in the right upper lobe measures 11 mm, image 53 of series 6. Index solid nodule within the posteromedial subpleural left lower lobe measures 1.5 cm, image 91 of series 6. Solid subpleural nodule in the left upper lobe measures 9 mm,  image 61 of series 6. Upper Abdomen: No acute abnormality. 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 No aggressive lytic or sclerotic bone lesions. Review of the MIP images confirms the above findings. IMPRESSION: 1. Examination is negative for acute pulmonary emboli. 2. Multiple bilateral pulmonary nodules, at least 1 of which is cavitary in appearance. The appearance is nonspecific. Differential considerations include multiple septic emboli, granulomatous inflammation/infection or metastatic disease. Careful clinical correlation is advised. Electronically Signed   By: Signa Kellaylor  Stroud M.D.   On: 05/20/2017 16:16    Procedures Procedures (including critical care time)  Medications Ordered in ED Medications  aspirin chewable tablet 162 mg (162 mg Oral Given 05/20/17 1525)  morphine 4 MG/ML injection 4 mg (4 mg Intravenous Given 05/20/17 1527)  gi cocktail (Maalox,Lidocaine,Donnatal) (30 mLs Oral Given 05/20/17 1525)  sodium chloride 0.9 % bolus 1,000 mL (1,000 mLs Intravenous New Bag/Given 05/20/17 1519)  iopamidol (ISOVUE-370) 76 % injection (100 mLs   Contrast Given 05/20/17 1553)     Initial Impression / Assessment and Plan / ED Course  I have reviewed the triage vital signs and the nursing notes.  Pertinent labs & imaging results that were available during my care of the patient were reviewed by me and considered in my medical decision making (see chart for details).     56 y.o. male here with central/L sided CP x2 days, dry cough, and also improving L sided dental abscess x1 month. On exam, Waller lung exam, mild L anterior chest wall tenderness, no pedal edema, no tachycardia or hypoxia; L lower posterior molars decayed with small abscess around the sockets of teeth 18-19, handling secretions well, no trismus, no evidence of ludwig's. Work up thus far reveals: EKG with nonspecific T changes in anterolateral leads but otherwise unremarkable, trop neg, BMP with mildly elevated Cr 1.34 (unclear baseline) and marginally low K 3.4, CBC with leukocytosis 16.0 so will add on differential, CXR showing b/l nodular densities cannot exclude pulm nodules CT chest recommended. Given pleuritic CP and abnormal CXR, will proceed directly to CT chest rather than obtaining D-dimer first, even though he's otherwise low risk for PE (could be septic emboli from dental abscess?). Will give ASA (162mg  since he already took 2 today), morphine, GI cocktail, and fluids then reassess; if pain persists, will consider NTG. Will reassess shortly.   4:29 PM Differential run as another CBC w/diff, which confirms leukocytosis 17.6 with slight neutrophilic predominance. CT chest with multiple b/l pulm nodules at least 1 with cavitary appearance, differential includes septic emboli, granulomatous inflammation/infection, metastatic disease. Pt feeling better after meds. Given concern for possible septic emboli seeded from dental abscess, will get blood cultures and lactic level; will proceed with admission for ACS r/o and further eval of these pulmonary nodules/possible septic  emboli. Discussed case with my attending Dr. Dalene SeltzerSchlossman who agrees with plan.   4:40 PM [facility] and will [facility]. Holding orders to be placed by admitting team. Please see their notes for further documentation of care. I appreciate their help with this pleasant pt's care. Pt stable at time of admission.    Final Clinical Impressions(s) / ED Diagnoses   Final diagnoses:  Precordial pain  Neutrophilic leukocytosis  Pulmonary nodules  Abnormal EKG  Dental abscess  AKI (acute kidney injury) [facility](HCC)    ED Discharge Orders    [address]       Shykeem Resurreccion, [place], [place]PA-C 05/20/17 1640    Alvira MondaySchlossman, Erin, MD 05/21/17 1843

## 2017-05-20 NOTE — Progress Notes (Signed)
Pharmacy Antibiotic Note  Drew KehrGregory Waller is a 56 y.o. male admitted on 05/20/2017 with pneumonia.  Pharmacy has been consulted for Vancomycin and Zosyn dosing.  Plan: Vancomycin 1500 mg IV now, then 1250 mg IV q24 hr (est AUC 452 based on SCr 1.34)  Measure vancomycin AUC at steady state as indicated  Zosyn 3.375 g IV given once over 30 minutes, then every 8 hrs by 4-hr infusion  Consider checking MRSA PCR to guide vancomycin LOT  Height: 5\' 6"  (167.6 cm) Weight: 180 lb (81.6 kg) IBW/kg (Calculated) : 63.8  Temp (24hrs), Avg:98.6 F (37 C), Min:98.6 F (37 C), Max:98.6 F (37 C)  Recent Labs  Lab 05/20/17 1402 05/20/17 1713  WBC 17.6*  16.0*  --   CREATININE 1.34*  --   LATICACIDVEN  --  0.90    Estimated Creatinine Clearance: 63.2 mL/min (A) (by C-G formula based on SCr of 1.34 mg/dL (H)).    No Known Allergies  Antimicrobials this admission: 12/16 Zosyn >>  12/16 Vanc >>   Dose adjustments this admission: ---  Microbiology results: 12/16 BCx: sent 12/16 Sputum: sent    Thank you for allowing pharmacy to be a part of this patient's care.  Drew Waller, PharmD, BCPS Pager: [phone] 05/20/2017, 7:16 PM

## 2017-05-21 ENCOUNTER — Encounter (HOSPITAL_COMMUNITY): Payer: Self-pay | Admitting: Cardiology

## 2017-05-21 ENCOUNTER — Other Ambulatory Visit (HOSPITAL_COMMUNITY): Payer: 59

## 2017-05-21 ENCOUNTER — Inpatient Hospital Stay (HOSPITAL_COMMUNITY): Payer: 59

## 2017-05-21 DIAGNOSIS — R072 Precordial pain: Secondary | ICD-10-CM

## 2017-05-21 DIAGNOSIS — R9431 Abnormal electrocardiogram [ECG] [EKG]: Secondary | ICD-10-CM

## 2017-05-21 DIAGNOSIS — R0789 Other chest pain: Principal | ICD-10-CM

## 2017-05-21 LAB — COMPREHENSIVE METABOLIC PANEL
ALT: 14 U/L — ABNORMAL LOW (ref 17–63)
ANION GAP: 7 (ref 5–15)
AST: 19 U/L (ref 15–41)
Albumin: 3.2 g/dL — ABNORMAL LOW (ref 3.5–5.0)
Alkaline Phosphatase: 83 U/L (ref 38–126)
BUN: 7 mg/dL (ref 6–20)
CALCIUM: 8.2 mg/dL — AB (ref 8.9–10.3)
CO2: 26 mmol/L (ref 22–32)
CREATININE: 1.13 mg/dL (ref 0.61–1.24)
Chloride: 103 mmol/L (ref 101–111)
GLUCOSE: 91 mg/dL (ref 65–99)
POTASSIUM: 3.4 mmol/L — AB (ref 3.5–5.1)
Sodium: 136 mmol/L (ref 135–145)
TOTAL PROTEIN: 6.8 g/dL (ref 6.5–8.1)
Total Bilirubin: 1 mg/dL (ref 0.3–1.2)

## 2017-05-21 LAB — EXPECTORATED SPUTUM ASSESSMENT W GRAM STAIN, RFLX TO RESP C: Special Requests: NORMAL

## 2017-05-21 LAB — LIPID PANEL
CHOL/HDL RATIO: 3.3 ratio
CHOLESTEROL: 141 mg/dL (ref 0–200)
HDL: 43 mg/dL (ref >40)
LDL CALC: 81 mg/dL (ref 0–99)
TRIGLYCERIDES: 85 mg/dL (ref <150)
VLDL: 17 mg/dL (ref 0–40)

## 2017-05-21 LAB — CBC
HEMATOCRIT: 36.1 % — AB (ref 39.0–52.0)
Hemoglobin: 12.2 g/dL — ABNORMAL LOW (ref 13.0–17.0)
MCH: 28.6 pg (ref 26.0–34.0)
MCHC: 33.8 g/dL (ref 30.0–36.0)
MCV: 84.7 fL (ref 78.0–100.0)
PLATELETS: 258 10*3/uL (ref 150–400)
RBC: 4.26 MIL/uL (ref 4.22–5.81)
RDW: 14.1 % (ref 11.5–15.5)
WBC: 12.4 10*3/uL — AB (ref 4.0–10.5)

## 2017-05-21 LAB — TROPONIN I

## 2017-05-21 LAB — LACTATE DEHYDROGENASE: LDH: 128 U/L (ref 98–192)

## 2017-05-21 LAB — MRSA PCR SCREENING: MRSA BY PCR: NEGATIVE

## 2017-05-21 LAB — STREP PNEUMONIAE URINARY ANTIGEN: STREP PNEUMO URINARY ANTIGEN: NEGATIVE

## 2017-05-21 LAB — HIV ANTIBODY (ROUTINE TESTING W REFLEX): HIV Screen 4th Generation wRfx: NONREACTIVE

## 2017-05-21 LAB — PSA: PROSTATIC SPECIFIC ANTIGEN: 0.31 ng/mL (ref 0.00–4.00)

## 2017-05-21 MED ORDER — METOPROLOL TARTRATE 25 MG PO TABS
25.0000 mg | ORAL_TABLET | ORAL | Status: AC
  Administered 2017-05-21: 25 mg via ORAL
  Filled 2017-05-21: qty 1

## 2017-05-21 MED ORDER — IOPAMIDOL (ISOVUE-300) INJECTION 61%
INTRAVENOUS | Status: AC
  Administered 2017-05-21: 75 mL via INTRAVENOUS
  Filled 2017-05-21: qty 75

## 2017-05-21 MED ORDER — KCL IN DEXTROSE-NACL 20-5-0.9 MEQ/L-%-% IV SOLN
INTRAVENOUS | Status: DC
  Administered 2017-05-21 – 2017-05-23 (×4): via INTRAVENOUS
  Filled 2017-05-21 (×5): qty 1000

## 2017-05-21 MED ORDER — MORPHINE SULFATE (PF) 4 MG/ML IV SOLN
4.0000 mg | INTRAVENOUS | Status: AC | PRN
  Administered 2017-05-22 – 2017-05-23 (×3): 4 mg via INTRAVENOUS
  Filled 2017-05-21 (×4): qty 1

## 2017-05-21 MED ORDER — POTASSIUM CHLORIDE IN NACL 20-0.9 MEQ/L-% IV SOLN
INTRAVENOUS | Status: DC
  Filled 2017-05-21: qty 1000

## 2017-05-21 MED ORDER — TRAZODONE HCL 50 MG PO TABS
50.0000 mg | ORAL_TABLET | Freq: Once | ORAL | Status: AC
  Administered 2017-05-21: 50 mg via ORAL
  Filled 2017-05-21: qty 1

## 2017-05-21 NOTE — Progress Notes (Signed)
PROGRESS NOTE  Drew Waller  ZOX:[id number] DOB: August 30, 1960 DOA: 05/20/2017 PCP: Veryl Speak, FNP   Brief Narrative: Drew Waller is a 56 y.o. male with no medical follow up who presented to the ED with continued constant, severe left anterior chest pain/pressure radiating to the midchest worse with breathing and nonproductive cough for 2 days. 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 On evaluation chest was tender to palpation, RRR, lungs clear, abscess around left mandibular molars was noted. EKG with nonspecific T changes in anterolateral leads, troponin neg, mildly elevated Cr 1.34 (unclear baseline), and leukocytosis to 16k. UDS + THC, opiates and cocaine. CXR showed nonspecific nodular densities and subsequent CT chest confirmed multiple bilateral nodules, at least one with cavitary appearance. He was given aspirin, morphine, GI cocktail with improvement in pain. He was admitted for ACS rule out and further evaluation of pulmonary nodules with working DDx septic emboli, granulomatous or metastatic disease.   Assessment & Plan: Principal Problem:   Chest pain Active Problems:   Leukocytosis   Hypokalemia   Renal insufficiency   Abnormal EKG  Chest pain: Atypical for cardiac etiology, more pleuritic/MSK. Cardiac enzymes negative.  - Cardiology consulted, recommending echocardiogram and likely outpatient stress testing. They've ordered coronary CT, however. - Will check risk stratification labs  Pulmonary nodules and cavitary lung lesion: DDx septic emboli in setting of dental abscess, granulomatous or metastatic disease (felt less likely). HIV Ab ordered. - Appreciate pulmonology consult.  - Monitor blood cultures. If positive would need ID consult, TEE.  - Check sputum gram stain, culture, AFB  - On broad antimicrobial coverage, will DC  vancomycin.   Elevated creatinine: Unsure of baseline.  - Will monitor with IVF's.  - Renally dose medications - Urged to avoid NSAIDs - Got CTA chest, now coronary CT ordered. Will watch BMP closely  Hypokalemia:  - Replace in IVF and recheck  DVT prophylaxis: Lovenox Code Status: Full Family Communication: Wife at bedside Disposition Plan: Home once stable.  Consultants:   Pulmonology  Cardiology  Procedures:   Echocardiogram ordered  Antimicrobials:  Vancomycin 12/16  Zosyn 12/16 >>   Azithromycin 12/16 >>   Subjective: Chest pain remains, confirmed character as above.   Objective: Vitals:   05/20/17 1930 05/20/17 2000 05/20/17 2037 05/21/17 0438  BP:   (!) 159/91 135/66  Pulse: 87 96 81 95  Resp: 20 16    Temp:   98.3 F (36.8 C) 98.6 F (37 C)  TempSrc:   Oral Oral  SpO2: 97% 97% 98% 97%  Weight:   76.9 kg (169 lb 8.5 oz)   Height:   5\' 6"  (1.676 m)     Intake/Output Summary (Last 24 hours) at 05/21/2017 1312 Last data filed at 05/21/2017 0500 Gross per 24 hour  Intake 1095 ml  Output 500 ml  Net 595 ml   Filed Weights   05/20/17 1334 05/20/17 2037  Weight: 81.6 kg (180 lb) 76.9 kg (169 lb 8.5 oz)    Gen: 56 y.o. male in no distress Pulm: Non-labored breathing room air. Clear to auscultation bilaterally.  CV: Regular rate and rhythm. No murmur, rub, or gallop. No JVD, no pedal edema. Mildly tender to palpation on sternum and left of sternum.  GI: Abdomen soft, non-tender, non-distended, with normoactive bowel sounds. No organomegaly or masses felt. Ext: Warm, no deformities Skin: No rashes, lesions  no ulcers Neuro: Alert and oriented. No focal neurological deficits. Psych: Judgement and insight appear normal. Mood & affect appropriate.   Data Reviewed: I have personally reviewed following labs and imaging studies  CBC: Recent Labs  Lab 05/20/17 1402 05/21/17 0157  WBC 17.6*  16.0* 12.4*  NEUTROABS 13.5*  --   HGB 14.1  14.1  12.2*  HCT 41.0  42.2 36.1*  MCV 85.1  85.4 84.7  PLT 321  322 258   Basic Metabolic Panel: Recent Labs  Lab 05/20/17 1402 05/21/17 0157  NA 138 136  K 3.4* 3.4*  CL 102 103  CO2 27 26  GLUCOSE 101* 91  BUN 10 7  CREATININE 1.34* 1.13  CALCIUM 9.2 8.2*   GFR: Estimated Creatinine Clearance: 72.9 mL/min (by C-G formula based on SCr of 1.13 mg/dL). Liver Function Tests: Recent Labs  Lab 05/21/17 0157  AST 19  ALT 14*  ALKPHOS 83  BILITOT 1.0  PROT 6.8  ALBUMIN 3.2*   No results for input(s): LIPASE, AMYLASE in the last 168 hours. No results for input(s): AMMONIA in the last 168 hours. Coagulation Profile: No results for input(s): INR, PROTIME in the last 168 hours. Cardiac Enzymes: Recent Labs  Lab 05/20/17 2107 05/21/17 0157 05/21/17 0828  TROPONINI <0.03 <0.03 <0.03   BNP (last 3 results) No results for input(s): PROBNP in the last 8760 hours. HbA1C: No results for input(s): HGBA1C in the last 72 hours. CBG: No results for input(s): GLUCAP in the last 168 hours. Lipid Profile: No results for input(s): CHOL, HDL, LDLCALC, TRIG, CHOLHDL, LDLDIRECT in the last 72 hours. Thyroid Function Tests: No results for input(s): TSH, T4TOTAL, FREET4, T3FREE, THYROIDAB in the last 72 hours. Anemia Panel: No results for input(s): VITAMINB12, FOLATE, FERRITIN, TIBC, IRON, RETICCTPCT in the last 72 hours. Urine analysis: No results found for: COLORURINE, APPEARANCEUR, LABSPEC, PHURINE, GLUCOSEU, HGBUR, BILIRUBINUR, KETONESUR, PROTEINUR, UROBILINOGEN, NITRITE, LEUKOCYTESUR Recent Results (from the past 240 hour(s))  MRSA PCR Screening     Status: None   Collection Time: 05/21/17  9:10 AM  Result Value Ref Range Status   MRSA by PCR NEGATIVE NEGATIVE Final    Comment:        The GeneXpert MRSA Assay (FDA approved for NASAL specimens only), is one component of a comprehensive MRSA colonization surveillance program. It is not intended to diagnose MRSA infection nor  to guide or monitor treatment for MRSA infections.   Culture, sputum-assessment     Status: None   Collection Time: 05/21/17  9:19 AM  Result Value Ref Range Status   Specimen Description SPUTUM  Final   Special Requests Normal  Final   Sputum evaluation THIS SPECIMEN IS ACCEPTABLE FOR SPUTUM CULTURE  Final   Report Status 05/21/2017 FINAL  Final  Culture, respiratory (NON-Expectorated)     Status: None (Preliminary result)   Collection Time: 05/21/17  9:19 AM  Result Value Ref Range Status   Specimen Description SPUTUM  Final   Special Requests Normal Reflexed from [id number]  Final   Gram Stain   Final    ABUNDANT WBC PRESENT,BOTH PMN AND MONONUCLEAR FEW SQUAMOUS EPITHELIAL CELLS PRESENT FEW GRAM POSITIVE RODS FEW GRAM POSITIVE COCCI IN PAIRS RARE GRAM NEGATIVE RODS Performed at [facility], [address] [place], [place] [address]    Culture PENDING  Incomplete   Report Status PENDING  Incomplete      Radiology Studies: Dg Chest 2 View  Result Date: 05/20/2017 CLINICAL DATA:  Chest  pain EXAM: CHEST  2 VIEW COMPARISON:  None. FINDINGS: There are nodular densities in both lungs. These range in size from 6-10 mm. Heart is normal in size. No pneumothorax or pleural effusion. IMPRESSION: Bilateral nodular densities are noted. Pulmonary nodules cannot be excluded. CT chest is recommended. Electronically Signed   By: Jolaine ClickArthur  Hoss M.D.   On: 05/20/2017 14:45   Ct Angio Chest Pe W And/or Wo Contrast  Result Date: 05/20/2017 CLINICAL DATA:  Evaluate for acute pulmonary embolus. EXAM: CT ANGIOGRAPHY CHEST WITH CONTRAST TECHNIQUE: Multidetector CT imaging of the chest was performed using the standard protocol during bolus administration of intravenous contrast. Multiplanar CT image reconstructions and MIPs were obtained to evaluate the vascular anatomy. CONTRAST:  100mL ISOVUE-370 IOPAMIDOL (ISOVUE-370) INJECTION 76% COMPARISON:  Chest radiograph 05/20/2017 FINDINGS: Cardiovascular:  Satisfactory opacification of the pulmonary arteries to the segmental level. No evidence of pulmonary embolism. Normal heart size. No pericardial effusion. Mediastinum/Nodes: No enlarged mediastinal, hilar, or axillary lymph nodes. Thyroid gland, trachea, and esophagus demonstrate no significant findings. Lungs/Pleura: Multiple bilateral pulmonary nodules noted. At least 1 of these has a central cavitary component. Index cavitary nodule in the right upper lobe measures 11 mm, image 53 of series 6. Index solid nodule within the posteromedial subpleural left lower lobe measures 1.5 cm, image 91 of series 6. Solid subpleural nodule in the left upper lobe measures 9 mm, image 61 of series 6. Upper Abdomen: No acute abnormality. 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 No aggressive lytic or sclerotic bone lesions. Review of the MIP images confirms the above findings. IMPRESSION: 1. Examination is negative for acute pulmonary emboli. 2. Multiple bilateral pulmonary nodules, at least 1 of which is cavitary in appearance. The appearance is nonspecific. Differential considerations include multiple septic emboli, granulomatous inflammation/infection or metastatic disease. Careful clinical correlation is advised. Electronically Signed   By: Signa Kellaylor  Stroud M.D.   On: 05/20/2017 16:16    Scheduled Meds: . enoxaparin (LOVENOX) injection  40 mg Subcutaneous Q24H   Continuous Infusions: . azithromycin Stopped (05/20/17 2018)  . piperacillin-tazobactam (ZOSYN)  IV 3.375 g (05/21/17 1221)  . vancomycin       LOS: 1 day   Time spent: 25 minutes.  Hazeline Junkeryan Grunz, MD [facility] Pager [phone]  If 7PM-7AM, please contact night-coverage [email or web address] Password [email or web address] 05/21/2017, 1:12 PM

## 2017-05-21 NOTE — Progress Notes (Signed)
Sp[facility] with CT Brittney at [facility], updated given to pt CCTA test currently on hold for now due to current RESP.ISOLATION to r/o TB status.  SRP, RN

## 2017-05-21 NOTE — Progress Notes (Addendum)
Pt test on hold per CT department at present time due to HR 80-90 (Target HR is 60 for CT proc) and PPE status. MD updated  CT will contact Dr. Mayford Knifeurner for additional orders. SRP,RN

## 2017-05-21 NOTE — CV Procedure (Signed)
Echocardiogram not complete because [facility] Drew [facility]. Will attempt echocardiogram tomorrow morning.  Leta Junglingiffany Therin Vetsch RDCS

## 2017-05-21 NOTE — Progress Notes (Signed)
    Unable to do CCTA due to patient being on respiratory isolation for rule out TB. Will plan to follow up outpatient with outpatient stress test or CCTA once over his pulmonary issues.   Berton BonJanine Aragorn Recker, AGNP-C [facility] 05/21/2017  3:07 PM Pager: [phone]

## 2017-05-21 NOTE — Consult Note (Signed)
Name: Drew KehrGregory Waller MRN: [id number] DOB: 08/18/1960    ADMISSION DATE:  05/20/2017 CONSULTATION DATE:  05/21/17  REFERRING MD :  Dr. Jarvis NewcomerGrunz / [facility]   CHIEF COMPLAINT:  Chest Pain    HISTORY OF PRESENT ILLNESS:  56 y/o M who [facility]/16 with reports of a two day history of chest pain, non-productive cough and one month history of dental abscess.    The patient reports on Friday 12/14 he began feeling like his chest was tight, hard to breathe.  He states "I've never been sick but I thought I was having a heart attack".  He kept moving around and eventually burped and felt better after.  He thought it was relieved.  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  He finally decided to go to the hospital on on Sunday 12/16.    The patient was [facility] for [facility] evaluation of chest pain.  UDS was positive for opiates, cocaine, marijuana.  Pan cultures including sputum for AFB were obtained.  Cardiology consulted for evaluation of chest pain.  Troponin was negative x3.  CTA of the chest assessed which was negative for PE but showed multiple bilateral pulmonary nodules, with at least 1 having a cavitary appearance on the R.     PCCM consulted for evaluation of abnormal CT chest, chest pain.   The patient denies cough, hemoptysis, dyspnea with exertion, night sweats, swollen lymph nodes, weight loss.  He has not had a colonoscopy, prostate exam or testicular exam.  Denies family history of RA, lupus, scleroderma, Sjgren's.  PAST MEDICAL HISTORY :   has no past medical history on file.   has a past surgical history that includes Tumor excision.  Prior to Admission medications   Medication Sig Start Date End Date Taking? Authorizing Provider  ciprofloxacin (CIPRO) 500 MG tablet Take 1 tablet (500 mg total) by mouth 2 (two) times daily. Patient not taking: Reported on 05/20/2017 02/22/14   Pisciotta, Joni ReiningNicole, PA-C    doxycycline (VIBRAMYCIN) 100 MG capsule Take 1 capsule (100 mg total) by mouth 2 (two) times daily. One po bid x 7 days Patient not taking: Reported on 05/20/2017 02/22/14   Street, Stone MountainMercedes, PA-C  HYDROcodone-acetaminophen (NORCO/VICODIN) 5-325 MG per tablet Take 1-2 tablets by mouth every 6 hours as needed for pain. Patient not taking: Reported on 05/20/2017 02/22/14   Pisciotta, Joni ReiningNicole, PA-C    No Known Allergies  FAMILY HISTORY:  family history includes Diabetes in his mother; Healthy in his brother and sister; Heart attack in his maternal grandmother and mother; Hypertension in his father, maternal grandmother, and mother; Prostate cancer in his father.  SOCIAL HISTORY:  reports that  has never smoked. he has never used smokeless tobacco. He reports that he drinks about 4.2 oz of alcohol per week. He reports that he does not use drugs.  REVIEW OF SYSTEMS:  POSITIVES IN BOLD  Constitutional: Negative for fever, chills, weight loss, malaise/fatigue and diaphoresis.  HENT: Negative for hearing loss, ear pain, nosebleeds, congestion, sore throat, neck pain, tinnitus and ear discharge.   Eyes: Negative for blurred vision, double vision, photophobia, pain, discharge and redness.  Respiratory: Negative for non-productive cough, hemoptysis, sputum production, shortness of breath, wheezing and stridor.   Cardiovascular: Negative for chest pain, palpitations, orthopnea, claudication, leg swelling and PND.  Gastrointestinal: Negative for heartburn, nausea, vomiting, abdominal pain, diarrhea, constipation, blood in stool and melena.  Genitourinary: Negative for dysuria, urgency, frequency, hematuria and flank pain.  Musculoskeletal: Negative for myalgias, back pain, joint pain and falls.  Skin: Negative for itching and rash.  Neurological: Negative for dizziness, tingling, tremors, sensory change, speech change, focal weakness, seizures, loss of consciousness, weakness and headaches.   Endo/Heme/Allergies: Negative for environmental allergies and polydipsia. Does not bruise/bleed easily.  SUBJECTIVE:   VITAL SIGNS: Temp:  [98.3 F (36.8 C)-98.8 F (37.1 C)] 98.8 F (37.1 C) (12/17 1356) Pulse Rate:  [74-96] 84 (12/17 1356) Resp:  [14-20] 14 (12/17 1356) BP: (124-159)/(66-95) 124/77 (12/17 1356) SpO2:  [97 %-98 %] 97 % (12/17 1356) Weight:  [169 lb 8.5 oz (76.9 kg)] 169 lb 8.5 oz (76.9 kg) (12/16 2037)  PHYSICAL EXAMINATION: General: well developed adult male in NAD, walking around in room.  Wife at bedside HEENT: MM pink/moist, no jvd, left lower broken molar, swollen / erythematous gum with drainage Neuro: AAOx4, speech clear, MAE CV: s1s2 rrr, no murmurs PULM: even/non-labored, lungs bilaterally diminished but clear  ZO:XWRUGI:soft, non-tender, bsx4 active  Extremities: warm/dry, no edema  Skin: no rashes or lesions   Recent Labs  Lab 05/20/17 1402 05/21/17 0157  NA 138 136  K 3.4* 3.4*  CL 102 103  CO2 27 26  BUN 10 7  CREATININE 1.34* 1.13  GLUCOSE 101* 91    Recent Labs  Lab 05/20/17 1402 05/21/17 0157  HGB 14.1  14.1 12.2*  HCT 41.0  42.2 36.1*  WBC 17.6*  16.0* 12.4*  PLT 321  322 258    Dg Chest 2 View  Result Date: 05/20/2017 CLINICAL DATA:  Chest pain EXAM: CHEST  2 VIEW COMPARISON:  None. FINDINGS: There are nodular densities in both lungs. These range in size from 6-10 mm. Heart is normal in size. No pneumothorax or pleural effusion. IMPRESSION: Bilateral nodular densities are noted. Pulmonary nodules cannot be excluded. CT chest is recommended. Electronically Signed   By: Jolaine ClickArthur  Hoss M.D.   On: 05/20/2017 14:45   Ct Angio Chest Pe W And/or Wo Contrast  Result Date: 05/20/2017 CLINICAL DATA:  Evaluate for acute pulmonary embolus. EXAM: CT ANGIOGRAPHY CHEST WITH CONTRAST TECHNIQUE: Multidetector CT imaging of the chest was performed using the standard protocol during bolus administration of intravenous contrast. Multiplanar CT  image reconstructions and MIPs were obtained to evaluate the vascular anatomy. CONTRAST:  100mL ISOVUE-370 IOPAMIDOL (ISOVUE-370) INJECTION 76% COMPARISON:  Chest radiograph 05/20/2017 FINDINGS: Cardiovascular: Satisfactory opacification of the pulmonary arteries to the segmental level. No evidence of pulmonary embolism. Normal heart size. No pericardial effusion. Mediastinum/Nodes: No enlarged mediastinal, hilar, or axillary lymph nodes. Thyroid gland, trachea, and esophagus demonstrate no significant findings. Lungs/Pleura: Multiple bilateral pulmonary nodules noted. At least 1 of these has a central cavitary component. Index cavitary nodule in the right upper lobe measures 11 mm, image 53 of series 6. Index solid nodule within the posteromedial subpleural left lower lobe measures 1.5 cm, image 91 of series 6. Solid subpleural nodule in the left upper lobe measures 9 mm, image 61 of series 6. Upper Abdomen: No acute abnormality. 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 No aggressive lytic or sclerotic bone lesions. Review of the MIP images confirms the above findings. IMPRESSION: 1. Examination is negative for acute pulmonary emboli. 2. Multiple bilateral pulmonary nodules, at least 1 of which is cavitary in appearance. The appearance is nonspecific. Differential considerations include multiple septic emboli, granulomatous inflammation/infection or metastatic disease. Careful clinical correlation is advised. Electronically Signed  By: Signa Kell M.D.   On: 05/20/2017 16:16    SIGNIFICANT EVENTS  12/16  Admit with chest pain   STUDIES:  12/16  UDS >> positive for opiate, cocaine, marijuana  12/16  CTA Chest >> neg for PE, multiple bilateral pulmonary nodules, 1 has a cavitary appearance on the R 12/17  CT Soft tissue neck >> 12/17  PSA >>  12/17  CEA >>  12/17  AFP >>   CULTURES BCx2 12/16 >>  Sputum 12/16 >>  Sputum AFB 12/16 >>  Quantiferon Gold 12/17  >>    ANTIBIOTICS Zosyn 12/16 >>   ASSESSMENT / PLAN:  Discussion:  56 y/o M who does not seek medical care admitted 12/16 with chest pain.  Abscessed tooth for the past month without dental care.  He was ruled out for acute MI & recommendations made for cardiac imaging as an outpatient given family history / current isolation.  ECHO pending.  Work up also included a CTA of the chest which was negative for PE but demonstrated bilateral pulmonary nodules with one area of cavitation on the right.   Abnormal CT Chest Bilateral Pulmonary Nodules with Cavitation Abscessed Tooth - Left lower molar Rule out Bacteremia   Plan: Assess CT neck to rule out abscess  Assess CEA, PSA, AFP Assess Quantiferon GOLD Await sputum for AFB, blood cultures ECHO pending  Follow intermittent CXR If the above above are negative, will consider autoimmune evaluation  Pending results of above, may need FOB evaluation   Thank you for the consultation.  PCCM will follow along with you.   Canary Brim, NP-C [facility] Pgr: [phone] or if no answer [phone] 05/21/2017, 5:39 PM

## 2017-05-21 NOTE — Consult Note (Signed)
Cardiology Consultation:   Patient ID: Drew Waller; [id number]; 1961/05/24   Admit date: 05/20/2017 Date of Consult: 05/21/2017  Primary Care Provider: Veryl Speak, FNP Primary Cardiologist: New- Dr Mayford Knife   Patient Profile:   Drew Waller is a 56 y.o. male with no known past medical history who is being seen today for the evaluation of chest pain at the request of Dr. Jarvis Newcomer.  History of Present Illness:   Mr. Drew Waller presented to the [facility] ED for evaluation of chest pain on 05/20/2017. His discomfort began about 2 days prior to presentation, on Friday while he was at work and experienced increased stress. He works as a Art therapist at [employer]. On Friday night he developed a nagging constant chest pressure. The discomfort made it hard to sleep that night. He has had mild shortness of breath, but no orthopnea, PND, edema, palpitations or lightheadedness. His chest discomfort continued on, was non radiating and worse with a deep breath. He went to work on Saturday but was too uncomfortable to work. The discomfort was constant and only relieved by morphine while at the hospital.   The patient has not been to see a doctor in over 20 years and thus is not aware of any healthy problems. He denies any previous cardiac issues or MI. He has had a painful tooth for the last month that he thinks is an abscess as he has had one before. He was having considerable tooth pain which is now resolved with the antibiotics.   Prior to Friday the patient had been very active with a very stressful and physical job with no exertional chest discomfort or dyspnea. He and his wife state that he has been very healthy without any problems.   The patient has never smoked. He drinks about twice a week- one pint and 3 beers usually. He denies any other drug use with his wife in the room.   History reviewed. No pertinent past medical history.  Past Surgical History:  Procedure  Laterality Date  . TUMOR EXCISION       Home Medications:  Prior to Admission medications   Medication Sig Start Date End Date Taking? Authorizing Provider  ciprofloxacin (CIPRO) 500 MG tablet Take 1 tablet (500 mg total) by mouth 2 (two) times daily. Patient not taking: Reported on 05/20/2017 02/22/14   Pisciotta, Joni Reining, PA-C  doxycycline (VIBRAMYCIN) 100 MG capsule Take 1 capsule (100 mg total) by mouth 2 (two) times daily. One po bid x 7 days Patient not taking: Reported on 05/20/2017 02/22/14   Street, [place]on[place], PA-C  HYDROcodone-acetaminophen (NORCO/VICODIN) 5-325 MG per tablet Take 1-2 tablets by mouth every 6 hours as needed for pain. Patient not taking: Reported on 05/20/2017 02/22/14   Pisciotta, Joni Reining, PA-C    Inpatient Medications: Scheduled Meds: . enoxaparin (LOVENOX) injection  40 mg Subcutaneous Q24H   Continuous Infusions: . azithromycin Stopped (05/20/17 2018)  . piperacillin-tazobactam (ZOSYN)  IV Stopped (05/21/17 0909)  . vancomycin     PRN Meds: morphine injection  Allergies:   No Known Allergies  Social History:   Social History   Socioeconomic History  . Marital status: Married    Spouse name: Not on file  . Number of children: Not on file  . Years of education: Not on file  . Highest education level: Not on file  Social Needs  . Financial resource strain: Not on file  . Food insecurity - worry: Not on file  . Food insecurity - inability: Not on  file  . Transportation needs - medical: Not on file  . Transportation needs - non-medical: Not on file  Occupational History  . Not on file  Tobacco Use  . Smoking status: Never Smoker  . Smokeless tobacco: Never Used  Substance and Sexual Activity  . Alcohol use: Yes    Alcohol/week: 4.2 oz    Types: 7 Cans of beer per week  . Drug use: No  . Sexual activity: Not on file  Other Topics Concern  . Not on file  Social History Narrative  . Not on file    Family History:    Family History    Problem Relation Age of Onset  . Heart attack Mother        at age 46  . Hypertension Mother   . Diabetes Mother   . Prostate cancer Father   . Hypertension Father   . Healthy Sister   . Healthy Brother   . Heart attack Maternal Grandmother        at age 106  . Hypertension Maternal Grandmother      ROS:  Please see the history of present illness.  ROS  All other ROS reviewed and negative.     Physical Exam/Data:   Vitals:   05/20/17 1930 05/20/17 2000 05/20/17 2037 05/21/17 0438  BP:   (!) 159/91 135/66  Pulse: 87 96 81 95  Resp: 20 16    Temp:   98.3 F (36.8 C) 98.6 F (37 C)  TempSrc:   Oral Oral  SpO2: 97% 97% 98% 97%  Weight:   169 lb 8.5 oz (76.9 kg)   Height:   5\' 6"  (1.676 m)     Intake/Output Summary (Last 24 hours) at 05/21/2017 1036 Last data filed at 05/21/2017 0500 Gross per 24 hour  Intake 1095 ml  Output 500 ml  Net 595 ml   Filed Weights   05/20/17 1334 05/20/17 2037  Weight: 180 lb (81.6 kg) 169 lb 8.5 oz (76.9 kg)   Body mass index is 27.36 kg/m.  General:  Well nourished, well developed, in no acute distress HEENT: normal Lymph: no adenopathy Neck: no JVD Endocrine:  No thryomegaly Vascular: No carotid bruits; FA pulses 2+ bilaterally without bruits  Cardiac:  normal S1, S2; RRR; no murmur  Lungs:  clear to auscultation bilaterally, no wheezing, rhonchi or rales  Abd: soft, nontender, no hepatomegaly  Ext: no edema Musculoskeletal:  No deformities, BUE and BLE strength normal and equal Skin: warm and dry  Neuro:  CNs 2-12 intact, no focal abnormalities noted Psych:  Normal affect   EKG:  The EKG was personally reviewed and demonstrates:  NSR 97 bpm, non-specific T wave changes in III, aVF, V4-V6 Telemetry:  Telemetry was personally reviewed and demonstrates:  Sinus rhythm in the 70's-90's  Relevant CV Studies:  Echo pending  Laboratory Data:  Chemistry Recent Labs  Lab 05/20/17 1402 05/21/17 0157  NA 138 136  K 3.4*  3.4*  CL 102 103  CO2 27 26  GLUCOSE 101* 91  BUN 10 7  CREATININE 1.34* 1.13  CALCIUM 9.2 8.2*  GFRNONAA 59* >60  GFRAA >60 >60  ANIONGAP 9 7    Recent Labs  Lab 05/21/17 0157  PROT 6.8  ALBUMIN 3.2*  AST 19  ALT 14*  ALKPHOS 83  BILITOT 1.0   Hematology Recent Labs  Lab 05/20/17 1402 05/21/17 0157  WBC 17.6*  16.0* 12.4*  RBC 4.82  4.94 4.26  HGB 14.1  14.1 12.2*  HCT 41.0  42.2 36.1*  MCV 85.1  85.4 84.7  MCH 29.3  28.5 28.6  MCHC 34.4  33.4 33.8  RDW 13.9  13.9 14.1  PLT 321  322 258   Cardiac Enzymes Recent Labs  Lab 05/20/17 2107 05/21/17 0157 05/21/17 0828  TROPONINI <0.03 <0.03 <0.03    Recent Labs  Lab 05/20/17 1413  TROPIPOC 0.00    BNPNo results for input(s): BNP, PROBNP in the last 168 hours.  DDimer No results for input(s): DDIMER in the last 168 hours.  Radiology/Studies:  Dg Chest 2 View  Result Date: 05/20/2017 CLINICAL DATA:  Chest pain EXAM: CHEST  2 VIEW COMPARISON:  None. FINDINGS: There are nodular densities in both lungs. These range in size from 6-10 mm. Heart is normal in size. No pneumothorax or pleural effusion. IMPRESSION: Bilateral nodular densities are noted. Pulmonary nodules cannot be excluded. CT chest is recommended. Electronically Signed   By: Jolaine ClickArthur  Hoss M.D.   On: 05/20/2017 14:45   Ct Angio Chest Pe W And/or Wo Contrast  Result Date: 05/20/2017 CLINICAL DATA:  Evaluate for acute pulmonary embolus. EXAM: CT ANGIOGRAPHY CHEST WITH CONTRAST TECHNIQUE: Multidetector CT imaging of the chest was performed using the standard protocol during bolus administration of intravenous contrast. Multiplanar CT image reconstructions and MIPs were obtained to evaluate the vascular anatomy. CONTRAST:  100mL ISOVUE-370 IOPAMIDOL (ISOVUE-370) INJECTION 76% COMPARISON:  Chest radiograph 05/20/2017 FINDINGS: Cardiovascular: Satisfactory opacification of the pulmonary arteries to the segmental level. No evidence of pulmonary embolism.  Normal heart size. No pericardial effusion. Mediastinum/Nodes: No enlarged mediastinal, hilar, or axillary lymph nodes. Thyroid gland, trachea, and esophagus demonstrate no significant findings. Lungs/Pleura: Multiple bilateral pulmonary nodules noted. At least 1 of these has a central cavitary component. Index cavitary nodule in the right upper lobe measures 11 mm, image 53 of series 6. Index solid nodule within the posteromedial subpleural left lower lobe measures 1.5 cm, image 91 of series 6. Solid subpleural nodule in the left upper lobe measures 9 mm, image 61 of series 6. Upper Abdomen: No acute abnormality. 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 No aggressive lytic or sclerotic bone lesions. Review of the MIP images confirms the above findings. IMPRESSION: 1. Examination is negative for acute pulmonary emboli. 2. Multiple bilateral pulmonary nodules, at least 1 of which is cavitary in appearance. The appearance is nonspecific. Differential considerations include multiple septic emboli, granulomatous inflammation/infection or metastatic disease. Careful clinical correlation is advised. Electronically Signed   By: Signa Kellaylor  Stroud M.D.   On: 05/20/2017 16:16    Assessment and Plan:   Chest pain -The patient's discomfort is very atypical with constant central chest pressure, tender to palpation in the left chest, non radiating, not related to activity, worse with deep breathing. Prior to Friday pt had no exertional symptoms.  -Troponins were negative X 4 -EKG with non-specific T wave changes anterior-lateral and inferior. No previous EKG to compare -CTA of chest negative for PE, however, there were multiple bilateral pulmonary nodules with nonspecific appearance. Differential include multiple septic emboli, granulomatous inflammation/infection or metastatic disease.  -UDS positive for opiates, cocaine, cannibas. Pt denies drug use to me.  -Pt's discomfort is  pleuritic in nature. Would treat his pulmonary issues and continue to monitor symptoms.  -Assess echo for wall motion and LV function. Would not recommend any ischemic testing at this time.   Leukocytosis -WBCs 17.6 on admission, 12.4 today -CTA of chest showed multiple pulmonary nodules. Pulmonology has  been consulted. -Pt being treated with IV antibiotics.  -Pt has had a dental abscess for the past month -Increased Sed rate - 44 -No elevation in lactic acid- 0.90 -Concern for possible septic emboli seeded from dental abscess. Blood cultures are pending.  -If BC become positive he will need TEE to assess heart valves.   Hypokalemia -K+ 3.4.  Being replaced by IM -Continue to follow  Renal insufficiency -SCr 1.34 on admission. Hydrated with IV fluids, SCr 1.13 today   For questions or updates, please contact [facility] Please consult [email or web address] for contact info under Cardiology/STEMI.   Signed, Berton BonJanine Elzie Sheets, NP  05/21/2017 10:36 AM

## 2017-05-22 ENCOUNTER — Inpatient Hospital Stay (HOSPITAL_COMMUNITY): Payer: 59

## 2017-05-22 DIAGNOSIS — K047 Periapical abscess without sinus: Secondary | ICD-10-CM

## 2017-05-22 DIAGNOSIS — D72829 Elevated white blood cell count, unspecified: Secondary | ICD-10-CM | POA: Diagnosis not present

## 2017-05-22 DIAGNOSIS — R071 Chest pain on breathing: Secondary | ICD-10-CM | POA: Diagnosis not present

## 2017-05-22 DIAGNOSIS — R7881 Bacteremia: Secondary | ICD-10-CM | POA: Diagnosis not present

## 2017-05-22 DIAGNOSIS — R0789 Other chest pain: Secondary | ICD-10-CM | POA: Diagnosis not present

## 2017-05-22 DIAGNOSIS — E876 Hypokalemia: Secondary | ICD-10-CM | POA: Diagnosis not present

## 2017-05-22 DIAGNOSIS — N179 Acute kidney failure, unspecified: Secondary | ICD-10-CM | POA: Diagnosis not present

## 2017-05-22 DIAGNOSIS — N289 Disorder of kidney and ureter, unspecified: Secondary | ICD-10-CM | POA: Diagnosis not present

## 2017-05-22 DIAGNOSIS — R918 Other nonspecific abnormal finding of lung field: Secondary | ICD-10-CM

## 2017-05-22 DIAGNOSIS — R072 Precordial pain: Secondary | ICD-10-CM | POA: Diagnosis not present

## 2017-05-22 LAB — CBC
HEMATOCRIT: 34.8 % — AB (ref 39.0–52.0)
Hemoglobin: 11.7 g/dL — ABNORMAL LOW (ref 13.0–17.0)
MCH: 28.3 pg (ref 26.0–34.0)
MCHC: 33.6 g/dL (ref 30.0–36.0)
MCV: 84.3 fL (ref 78.0–100.0)
Platelets: 289 10*3/uL (ref 150–400)
RBC: 4.13 MIL/uL — AB (ref 4.22–5.81)
RDW: 13.6 % (ref 11.5–15.5)
WBC: 11.2 10*3/uL — AB (ref 4.0–10.5)

## 2017-05-22 LAB — LIPID PANEL
Cholesterol: 131 mg/dL (ref 0–200)
HDL: 40 mg/dL — ABNORMAL LOW (ref >40)
LDL CALC: 80 mg/dL (ref 0–99)
Total CHOL/HDL Ratio: 3.3 RATIO
Triglycerides: 54 mg/dL (ref <150)
VLDL: 11 mg/dL (ref 0–40)

## 2017-05-22 LAB — BASIC METABOLIC PANEL
Anion gap: 5 (ref 5–15)
BUN: 6 mg/dL (ref 6–20)
CALCIUM: 8.5 mg/dL — AB (ref 8.9–10.3)
CO2: 27 mmol/L (ref 22–32)
CREATININE: 1.22 mg/dL (ref 0.61–1.24)
Chloride: 107 mmol/L (ref 101–111)
GFR calc Af Amer: >60 mL/min (ref >60)
GLUCOSE: 105 mg/dL — AB (ref 65–99)
Potassium: 3.4 mmol/L — ABNORMAL LOW (ref 3.5–5.1)
Sodium: 139 mmol/L (ref 135–145)

## 2017-05-22 LAB — LEGIONELLA PNEUMOPHILA SEROGP 1 UR AG: L. PNEUMOPHILA SEROGP 1 UR AG: NEGATIVE

## 2017-05-22 LAB — CEA: CEA1: 1.7 ng/mL (ref 0.0–4.7)

## 2017-05-22 LAB — ECHOCARDIOGRAM COMPLETE
HEIGHTINCHES: 66 in
Weight: 2712.54 oz

## 2017-05-22 LAB — AFP TUMOR MARKER: AFP, SERUM, TUMOR MARKER: 2.8 ng/mL (ref 0.0–8.3)

## 2017-05-22 MED ORDER — ACETAMINOPHEN 325 MG PO TABS
650.0000 mg | ORAL_TABLET | ORAL | Status: DC | PRN
  Administered 2017-05-23: 650 mg via ORAL
  Filled 2017-05-22: qty 2

## 2017-05-22 NOTE — Progress Notes (Signed)
PROGRESS NOTE  Drew Waller  WUJ:[id number] DOB: 04-Mar-1961 DOA: 05/20/2017 PCP: Veryl Speak, FNP   Brief Narrative: Drew Waller is a 56 y.o. male with no medical follow up who presented to the ED with continued constant, severe left anterior chest pain/pressure radiating to the midchest worse with breathing and nonproductive cough for 2 days. 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 On evaluation chest was tender to palpation, RRR, lungs clear, abscess around left mandibular molars was noted. EKG with nonspecific T changes in anterolateral leads, troponin neg, mildly elevated Cr 1.34 (unclear baseline), and leukocytosis to 16k. UDS + THC, opiates and cocaine. CXR showed nonspecific nodular densities and subsequent CT chest confirmed multiple bilateral nodules, at least one with cavitary appearance. He was given aspirin, morphine, GI cocktail with improvement in pain. He was admitted for ACS rule out and further evaluation of pulmonary nodules with working DDx septic emboli, granulomatous or metastatic disease.   Assessment & Plan: Principal Problem:   Chest pain Active Problems:   Leukocytosis   Hypokalemia   Renal insufficiency   Abnormal EKG  Chest pain: Atypical for cardiac etiology, more pleuritic/MSK. Cardiac enzymes negative. Echocardiogram with preserved EF, normal valves, no wall motion abnormalities. LDL is 80 - Cardiology recommends outpatient follow up.  - Monitor w/Tx of pulmonary condition  Pulmonary nodules and cavitary lung lesion: DDx septic emboli in setting of dental abscess, granulomatous or metastatic disease (felt less likely). HIV Ab ordered. - Appreciate pulmonology consult.  - Monitor blood cultures. If positive would need ID consult, TEE.  - Check sputum gram stain, culture (reincubated for better growth), AFB Cx/smear.    - Quantiferon pending. Empiric airborne precautions. - PSA, AFP, and CEA reassuring. - On broad antimicrobial coverage, zosyn and azithromycin.   Dental periapical abscesses: without drainable fluid collection on CT.  - Continue zosyn - Pt has insurance, I've urged him to go ahead and schedule an appointment with a dentist.   Abnormal soft tissue in the left carotid space: Seen on CT soft tissue neck. DDx includes LN, complex cyst; less morphologically consistent with paraganglioma or schwannoma.  - Recommend MRI neck w/contrast nonemergently. Will defer to outpatient setting after resolution of infection.   Elevated creatinine: Unsure of baseline.  - Will monitor with IVF's.  - Renally dose medications - Urged to avoid NSAIDs  Hypokalemia:  - Replace in IVF and recheck  DVT prophylaxis: Lovenox Code Status: Full Family Communication: Wife at bedside Disposition Plan: Home once stable.  Consultants:   Pulmonology  Cardiology  Procedures:   Echocardiogram 05/22/2017: - Left ventricle: The cavity size was normal. Wall thickness was   increased in a pattern of mild LVH. Systolic function was normal.   The estimated ejection fraction was in the range of 60% to 65%.   Wall motion was normal; there were no regional wall motion   abnormalities. Doppler parameters are consistent with abnormal   left ventricular relaxation (grade 1 diastolic dysfunction). The   E/e&' ratio is between 8-15, suggesting indeterminate LV filling   pressure. - Left atrium: The atrium was normal in size. - Inferior vena cava: The vessel was normal in size. The   respirophasic diameter changes were in the normal range (>= 50%),   consistent with normal central venous pressure.  Impressions: - LVEF 60-65%, mild LVH, normal wall motion, grade 1 DD,  indeterminate LV filling pressure, normal LA size, normal IVC, no   evidence for endocarditis.  Antimicrobials:  Vancomycin 12/16  Zosyn 12/16 >>    Azithromycin 12/16 >>   Subjective: Chest pain remains, confirmed character as above.   Objective: Vitals:   05/21/17 1356 05/21/17 2009 05/22/17 0541 05/22/17 1405  BP: 124/77 (!) 141/86 (!) 142/85 (!) 154/91  Pulse: 84 (!) 103 87 77  Resp: 14 15 17    Temp: 98.8 F (37.1 C) 99.4 F (37.4 C) (!) 101 F (38.3 C)   TempSrc: Oral Oral Axillary   SpO2: 97% 100% 97% 99%  Weight:      Height:        Intake/Output Summary (Last 24 hours) at 05/22/2017 1544 Last data filed at 05/22/2017 [id number] Gross per 24 hour  Intake 2606.67 ml  Output -  Net 2606.67 ml   Filed Weights   05/20/17 1334 05/20/17 2037  Weight: 81.6 kg (180 lb) 76.9 kg (169 lb 8.5 oz)    Gen: 56 y.o. male in no distress Pulm: Non-labored breathing room air. Clear to auscultation bilaterally.  CV: Regular rate and rhythm. No murmur, rub, or gallop. No JVD, no pedal edema. Mildly tender to palpation on sternum and left of sternum.  GI: Abdomen soft, non-tender, non-distended, with normoactive bowel sounds. No organomegaly or masses felt. Ext: Warm, no deformities Skin: No rashes, lesions no ulcers Neuro: Alert and oriented. No focal neurological deficits. Psych: Judgement and insight appear normal. Mood & affect appropriate.   Data Reviewed: I have personally reviewed following labs and imaging studies  CBC: Recent Labs  Lab 05/20/17 1402 05/21/17 0157 05/22/17 0431  WBC 17.6*  16.0* 12.4* 11.2*  NEUTROABS 13.5*  --   --   HGB 14.1  14.1 12.2* 11.7*  HCT 41.0  42.2 36.1* 34.8*  MCV 85.1  85.4 84.7 84.3  PLT 321  322 258 289   Basic Metabolic Panel: Recent Labs  Lab 05/20/17 1402 05/21/17 0157 05/22/17 0431  NA 138 136 139  K 3.4* 3.4* 3.4*  CL 102 103 107  CO2 27 26 27   GLUCOSE 101* 91 105*  BUN 10 7 6   CREATININE 1.34* 1.13 1.22  CALCIUM 9.2 8.2* 8.5*   GFR: Estimated Creatinine Clearance: 67.6 mL/min (by C-G formula based on SCr of 1.22 mg/dL). Liver Function Tests: Recent Labs   Lab 05/21/17 0157  AST 19  ALT 14*  ALKPHOS 83  BILITOT 1.0  PROT 6.8  ALBUMIN 3.2*   No results for input(s): LIPASE, AMYLASE in the last 168 hours. No results for input(s): AMMONIA in the last 168 hours. Coagulation Profile: No results for input(s): INR, PROTIME in the last 168 hours. Cardiac Enzymes: Recent Labs  Lab 05/20/17 2107 05/21/17 0157 05/21/17 0828  TROPONINI <0.03 <0.03 <0.03   BNP (last 3 results) No results for input(s): PROBNP in the last 8760 hours. HbA1C: No results for input(s): HGBA1C in the last 72 hours. CBG: No results for input(s): GLUCAP in the last 168 hours. Lipid Profile: Recent Labs    05/21/17 0821 05/22/17 0431  CHOL 141 131  HDL 43 40*  LDLCALC 81 80  TRIG 85 54  CHOLHDL 3.3 3.3   Thyroid Function Tests: No results for input(s): TSH, T4TOTAL, FREET4, T3FREE, THYROIDAB in the last 72 hours. Anemia Panel: No results for input(s): VITAMINB12, FOLATE, FERRITIN, TIBC, IRON, RETICCTPCT in the last 72 hours. Urine analysis: No results found for: COLORURINE, APPEARANCEUR, LABSPEC, PHURINE, GLUCOSEU, HGBUR, BILIRUBINUR, KETONESUR, PROTEINUR,  UROBILINOGEN, NITRITE, LEUKOCYTESUR Recent Results (from the past 240 hour(s))  Culture, blood (Routine X 2) w Reflex to ID Panel     Status: None (Preliminary result)   Collection Time: 05/20/17  5:06 PM  Result Value Ref Range Status   Specimen Description BLOOD RIGHT ANTECUBITAL  Final   Special Requests   Final    BOTTLES DRAWN AEROBIC AND ANAEROBIC Blood Culture adequate volume   Culture   Final    NO GROWTH 2 DAYS Performed at [facility], [address] [place], [place] [address]    Report Status PENDING  Incomplete  Culture, blood (Routine X 2) w Reflex to ID Panel     Status: None (Preliminary result)   Collection Time: 05/20/17  6:50 PM  Result Value Ref Range Status   Specimen Description BLOOD BLOOD LEFT HAND  Final   Special Requests   Final    BOTTLES DRAWN AEROBIC AND  ANAEROBIC Blood Culture adequate volume   Culture   Final    NO GROWTH 1 DAY Performed at [facility], [address], [place], [place] [address]    Report Status PENDING  Incomplete  Culture, blood (routine x 2) Call MD if unable to obtain prior to antibiotics being given     Status: None (Preliminary result)   Collection Time: 05/20/17  9:07 PM  Result Value Ref Range Status   Specimen Description BLOOD RIGHT HAND  Final   Special Requests AEROBIC BOTTLE ONLY Blood Culture adequate volume  Final   Culture   Final    NO GROWTH 1 DAY Performed at [facility], [address], [place], [place] [address]    Report Status PENDING  Incomplete  MRSA PCR Screening     Status: None   Collection Time: 05/21/17  9:10 AM  Result Value Ref Range Status   MRSA by PCR NEGATIVE NEGATIVE Final    Comment:        The GeneXpert MRSA Assay (FDA approved for NASAL specimens only), is one component of a comprehensive MRSA colonization surveillance program. It is not intended to diagnose MRSA infection nor to guide or monitor treatment for MRSA infections.   Culture, sputum-assessment     Status: None   Collection Time: 05/21/17  9:19 AM  Result Value Ref Range Status   Specimen Description SPUTUM  Final   Special Requests Normal  Final   Sputum evaluation THIS SPECIMEN IS ACCEPTABLE FOR SPUTUM CULTURE  Final   Report Status 05/21/2017 FINAL  Final  Culture, respiratory (NON-Expectorated)     Status: None (Preliminary result)   Collection Time: 05/21/17  9:19 AM  Result Value Ref Range Status   Specimen Description SPUTUM  Final   Special Requests Normal Reflexed from [id number]  Final   Gram Stain   Final    ABUNDANT WBC PRESENT,BOTH PMN AND MONONUCLEAR FEW SQUAMOUS EPITHELIAL CELLS PRESENT FEW GRAM POSITIVE RODS FEW GRAM POSITIVE COCCI IN PAIRS RARE GRAM NEGATIVE RODS    Culture   Final    CULTURE REINCUBATED FOR BETTER GROWTH Performed at [facility], [address], [place], [place] [address]    Report Status PENDING  Incomplete      Radiology Studies: Ct Soft Tissue Neck W Contrast  Result Date: 05/21/2017 CLINICAL DATA:  Dental pain and facial swelling for 1 month, follow-up LEFT mandibular abscess. EXAM: CT NECK WITH CONTRAST TECHNIQUE: Multidetector CT imaging of the neck was performed using the standard  protocol following the bolus administration of intravenous contrast. CONTRAST:  75mL ISOVUE-300 IOPAMIDOL (ISOVUE-300) INJECTION 61% COMPARISON:  None. FINDINGS: PHARYNX AND LARYNX: Normal.  Widely patent airway. SALIVARY GLANDS: Normal. THYROID: Normal. LYMPH NODES: No lymphadenopathy by CT size criteria. VASCULAR: Abnormal hypodense soft tissue splaying the LEFT carotid bifurcation. LIMITED INTRACRANIAL: Normal. VISUALIZED ORBITS: Normal. MASTOIDS AND VISUALIZED PARANASAL SINUSES: Mild paranasal sinus mucosal thickening without air-fluid levels. SKELETON: Multiple dental caries. Large tooth 15, tooth 18 and tooth 29 periapical abscess. Severe C6-7 degenerative disc and moderate neural foraminal narrowing. UPPER CHEST: Patchy RIGHT upper lobe airspace opacities RIGHT upper lobe corresponding to nodular densities on yesterday's CT chest. OTHER: None. IMPRESSION: 1. Poor dentition with multiple dental caries and periapical abscess. No drainable fluid collection. 2. Abnormal soft tissue LEFT carotid space, differential diagnosis includes lymph node or complex cyst, atypical features for paraganglioma or schwannoma. Recommend contrast-enhanced MRI of the neck on nonemergent basis versus ultrasound. Electronically Signed   By: Awilda Metroourtnay  Bloomer M.D.   On: 05/21/2017 18:29   Ct Angio Chest Pe W And/or Wo Contrast  Result Date: 05/20/2017 CLINICAL DATA:  Evaluate for acute pulmonary embolus. EXAM: CT ANGIOGRAPHY CHEST WITH CONTRAST TECHNIQUE: Multidetector CT imaging of the chest was performed using the standard protocol during bolus administration of intravenous  contrast. Multiplanar CT image reconstructions and MIPs were obtained to evaluate the vascular anatomy. CONTRAST:  100mL ISOVUE-370 IOPAMIDOL (ISOVUE-370) INJECTION 76% COMPARISON:  Chest radiograph 05/20/2017 FINDINGS: Cardiovascular: Satisfactory opacification of the pulmonary arteries to the segmental level. No evidence of pulmonary embolism. Normal heart size. No pericardial effusion. Mediastinum/Nodes: No enlarged mediastinal, hilar, or axillary lymph nodes. Thyroid gland, trachea, and esophagus demonstrate no significant findings. Lungs/Pleura: Multiple bilateral pulmonary nodules noted. At least 1 of these has a central cavitary component. Index cavitary nodule in the right upper lobe measures 11 mm, image 53 of series 6. Index solid nodule within the posteromedial subpleural left lower lobe measures 1.5 cm, image 91 of series 6. Solid subpleural nodule in the left upper lobe measures 9 mm, image 61 of series 6. Upper Abdomen: No acute abnormality. 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 No aggressive lytic or sclerotic bone lesions. Review of the MIP images confirms the above findings. IMPRESSION: 1. Examination is negative for acute pulmonary emboli. 2. Multiple bilateral pulmonary nodules, at least 1 of which is cavitary in appearance. The appearance is nonspecific. Differential considerations include multiple septic emboli, granulomatous inflammation/infection or metastatic disease. Careful clinical correlation is advised. Electronically Signed   By: Signa Kellaylor  Stroud M.D.   On: 05/20/2017 16:16    Scheduled Meds: . enoxaparin (LOVENOX) injection  40 mg Subcutaneous Q24H   Continuous Infusions: . azithromycin Stopped (05/21/17 1941)  . dextrose 5 % and 0.9 % NaCl with KCl 20 mEq/L 100 mL/hr at 05/22/17 0511  . piperacillin-tazobactam (ZOSYN)  IV Stopped (05/22/17 1533)     LOS: 2 days   Time spent: 25 minutes.  Hazeline Junkeryan Swayze Kozuch, MD [facility] Pager  [phone]  If 7PM-7AM, please contact night-coverage [email or web address] Password [facility] [email or web address] 05/22/2017, 3:44 PM

## 2017-05-22 NOTE — Progress Notes (Signed)
2D echo showed normal LVF with no RWMAs and normal valves.  Trop neg x 4 and atypical CP.  Please set up cardiology followup as outpt. Atypical CP that is mainly pleuritic and likely related to underlying pulmonary problems.  Will sign off.  Call with any questions.

## 2017-05-22 NOTE — Progress Notes (Signed)
  Echocardiogram 2D Echocardiogram has been performed.  Leta JunglingCooper, Stefana Lodico M 05/22/2017, 8:46 AM

## 2017-05-22 NOTE — Progress Notes (Signed)
Unable to get coronary CTA yesterday due to respiratory precautions.  2D echo pending.  Await results of echo.  Cardiac etiology of CP unlikely as it is much worse with deep breathing and sed rate is up with pulmonary findings.  If 2D echo is normal then can get outpt nuclear stress test.

## 2017-05-23 DIAGNOSIS — R072 Precordial pain: Secondary | ICD-10-CM | POA: Diagnosis not present

## 2017-05-23 DIAGNOSIS — R0789 Other chest pain: Secondary | ICD-10-CM | POA: Diagnosis not present

## 2017-05-23 DIAGNOSIS — N179 Acute kidney failure, unspecified: Secondary | ICD-10-CM | POA: Diagnosis not present

## 2017-05-23 DIAGNOSIS — K047 Periapical abscess without sinus: Secondary | ICD-10-CM | POA: Diagnosis not present

## 2017-05-23 LAB — BASIC METABOLIC PANEL
ANION GAP: 5 (ref 5–15)
BUN: 6 mg/dL (ref 6–20)
CALCIUM: 8.3 mg/dL — AB (ref 8.9–10.3)
CO2: 26 mmol/L (ref 22–32)
Chloride: 107 mmol/L (ref 101–111)
Creatinine, Ser: 1.19 mg/dL (ref 0.61–1.24)
GLUCOSE: 77 mg/dL (ref 65–99)
POTASSIUM: 4.6 mmol/L (ref 3.5–5.1)
SODIUM: 138 mmol/L (ref 135–145)

## 2017-05-23 LAB — CBC
HEMATOCRIT: 35 % — AB (ref 39.0–52.0)
HEMOGLOBIN: 11.6 g/dL — AB (ref 13.0–17.0)
MCH: 28.2 pg (ref 26.0–34.0)
MCHC: 33.1 g/dL (ref 30.0–36.0)
MCV: 85.2 fL (ref 78.0–100.0)
Platelets: 250 10*3/uL (ref 150–400)
RBC: 4.11 MIL/uL — ABNORMAL LOW (ref 4.22–5.81)
RDW: 13.6 % (ref 11.5–15.5)
WBC: 9.9 10*3/uL (ref 4.0–10.5)

## 2017-05-23 LAB — CULTURE, RESPIRATORY: SPECIAL REQUESTS: NORMAL

## 2017-05-23 LAB — CULTURE, RESPIRATORY W GRAM STAIN: Culture: NORMAL

## 2017-05-23 LAB — HEMOGLOBIN A1C
Hgb A1c MFr Bld: 5.5 % (ref 4.8–5.6)
MEAN PLASMA GLUCOSE: 111 mg/dL

## 2017-05-23 LAB — ACID FAST SMEAR (AFB)

## 2017-05-23 LAB — ACID FAST SMEAR (AFB, MYCOBACTERIA): Acid Fast Smear: NEGATIVE

## 2017-05-23 NOTE — Progress Notes (Signed)
PCCM Interval Note  TTE reassuring >> no evidence vegetations.  Cancer serologies normal Blood cx negative so far  Await quantGold, AFB's.  Will go ahead and send ANCA, a-GBM Ab's If we do not establish a dx with cx data or serologies then consider FOB vs repeat Ct scan after several weeks to assess for resolution on abx.   We will continue to follow with you   Baltazar Apo, MD, PhD 05/23/2017, 3:38 PM [facility] [phone] or if no answer [phone]

## 2017-05-23 NOTE — Progress Notes (Addendum)
Pharmacy Antibiotic Note  Drew Waller is a 56 y.o. male admitted on 05/20/2017 with pneumonia.  Pharmacy has been consulted for Vancomycin and Zosyn dosing. Vanc d/c'd 12/17 due to neg MRSA PCR  Plan: Day #3 Antibiotics  Continue Zosyn 3.375 g IV every 8 hrs by 4-hr infusion  Zithromax per Md  Awaiting for culture results and other labs ordered by CCM  Height: 5\' 6"  (167.6 cm) Weight: 169 lb 8.5 oz (76.9 kg) IBW/kg (Calculated) : 63.8  Temp (24hrs), Avg:99.6 F (37.6 C), Min:98.6 F (37 C), Max:100.9 F (38.3 C)  Recent Labs  Lab 05/20/17 1402 05/20/17 1713 05/21/17 0157 05/22/17 0431 05/23/17 0406  WBC 17.6*  16.0*  --  12.4* 11.2* 9.9  CREATININE 1.34*  --  1.13 1.22 1.19  LATICACIDVEN  --  0.90  --   --   --     Estimated Creatinine Clearance: 69.3 mL/min (by C-G formula based on SCr of 1.19 mg/dL).    No Known Allergies  Antimicrobials this admission: 12/16 Zosyn >>  12/16 Vanc >> 12/17 12/6 Zithromax >>  Dose adjustments this admission: ---  Microbiology results:  12/16 BCx x3: ngtd 12/16 Sputum: reinc for better growth 12/17 MRSA PCR: negative 12/17 strep pneumo urine: negative 12/16 HIV: NR 12/17 AFB x3: sent   Thank you for allowing pharmacy to be a part of this patient's care.  Hessie KnowsJustin M Jasdeep Kepner, PharmD, BCPS Pager [phone] 05/23/2017 10:25 AM

## 2017-05-23 NOTE — Progress Notes (Signed)
PROGRESS NOTE  Drew Waller  ZOX:[id number] DOB: July 28, 1960 DOA: 05/20/2017 PCP: Veryl Speak, FNP   Brief Narrative: Drew Waller is a 56 y.o. male with no medical follow up who presented to the ED with continued constant, severe left anterior chest pain/pressure radiating to the midchest worse with breathing and nonproductive cough for 2 days. 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 On evaluation chest was tender to palpation, RRR, lungs clear, abscess around left mandibular molars was noted. EKG with nonspecific T changes in anterolateral leads, troponin neg, mildly elevated Cr 1.34 (unclear baseline), and leukocytosis to 16k. UDS + THC, opiates and cocaine. CXR showed nonspecific nodular densities and subsequent CT chest confirmed multiple bilateral nodules, at least one with cavitary appearance. He was given aspirin, morphine, GI cocktail with improvement in pain. He was admitted for ACS rule out and further evaluation of pulmonary nodules with working DDx septic emboli, granulomatous or metastatic disease.   Assessment & Plan: Principal Problem:   Chest pain Active Problems:   Leukocytosis   Hypokalemia   Renal insufficiency   Abnormal EKG  Chest pain: Atypical for cardiac etiology, more pleuritic/MSK. Cardiac enzymes negative. Echocardiogram with preserved EF, normal valves, no wall motion abnormalities. LDL is 80 - Cardiology recommends outpatient follow up.  - Monitor w/Tx of pulmonary condition  Pulmonary nodules and cavitary lung lesion: DDx septic emboli in setting of dental abscess, granulomatous or metastatic disease (felt less likely). HIV NR. PSA, AFP, and CEA reassuring. WBC showing some improvement with ongoing fevers. - Appreciate pulmonology consult. Considering bronchoscopy if no improvement w/abx. - Monitor blood cultures,  NGTD. If positive would need ID consult, TEE.  - Check sputum gram stain, culture (reincubated for better growth), AFB Cx/smear also pending  - Quantiferon pending. Empiric airborne precautions. - On broad antimicrobial coverage, zosyn and azithromycin.   Dental periapical abscesses: without drainable fluid collection on CT.  - Continue zosyn - Pt has insurance, I've urged him to go ahead and schedule an appointment with a dentist.   Abnormal soft tissue in the left carotid space: Seen on CT soft tissue neck. DDx includes LN, complex cyst; less morphologically consistent with paraganglioma or schwannoma.  - Recommend MRI neck w/contrast nonemergently. Will defer to outpatient setting after resolution of infection.   Elevated creatinine: Unsure of baseline, stable. - Renally dose medications as needed - Urged to avoid NSAIDs  Hypokalemia: Resolved with replacement.   DVT prophylaxis: Lovenox Code Status: Full Family Communication: Wife at bedside Disposition Plan: Home once stable.  Consultants:   Pulmonology  Cardiology  Procedures:   Echocardiogram 05/22/2017: - Left ventricle: The cavity size was normal. Wall thickness was   increased in a pattern of mild LVH. Systolic function was normal.   The estimated ejection fraction was in the range of 60% to 65%.   Wall motion was normal; there were no regional wall motion   abnormalities. Doppler parameters are consistent with abnormal   left ventricular relaxation (grade 1 diastolic dysfunction). The   E/e&' ratio is between 8-15, suggesting indeterminate LV filling   pressure. - Left atrium: The atrium was normal in size. - Inferior vena cava: The vessel was normal in size. The   respirophasic diameter changes were in the normal range (>= 50%),   consistent with normal central venous pressure.  Impressions: - LVEF 60-65%, mild LVH, normal wall motion,  grade 1 DD,   indeterminate LV filling pressure, normal LA size, normal  IVC, no   evidence for endocarditis.  Antimicrobials:  Vancomycin 12/16  Zosyn 12/16 >>   Azithromycin 12/16 >>   Subjective: Chest pain remain is stable, intermittent, worse with deep breaths, overall severity is improved. No dyspnea, insignificant cough. Had chills, fever, night sweats last night.   Objective: Vitals:   05/22/17 2100 05/22/17 2300 05/23/17 0243 05/23/17 0541  BP: (!) 158/91   (!) 141/97  Pulse: 87   82  Resp: 18   18  Temp: 100.3 F (37.9 C) (!) 100.9 F (38.3 C) 98.6 F (37 C) 98.7 F (37.1 C)  TempSrc: Axillary Axillary Oral Oral  SpO2: 99%   100%  Weight:      Height:        Intake/Output Summary (Last 24 hours) at 05/23/2017 1148 Last data filed at 05/23/2017 0600 Gross per 24 hour  Intake 2940 ml  Output 600 ml  Net 2340 ml   Filed Weights   05/20/17 1334 05/20/17 2037  Weight: 81.6 kg (180 lb) 76.9 kg (169 lb 8.5 oz)    Gen: 56 y.o. male in no distress Pulm: Non-labored breathing room air. Clear to auscultation bilaterally.  CV: Regular rate and rhythm. No murmur, rub, or gallop. No JVD, no pedal edema. No tenderness. Shallow breathing unless asked to take deep breaths. GI: Abdomen soft, non-tender, non-distended, with normoactive bowel sounds. No organomegaly or masses felt. Ext: Warm, no deformities Skin: No rashes, lesions no ulcers Neuro: Alert and oriented. No focal neurological deficits. Psych: Judgement and insight appear normal. Mood & affect appropriate.   Data Reviewed: I have personally reviewed following labs and imaging studies  CBC: Recent Labs  Lab 05/20/17 1402 05/21/17 0157 05/22/17 0431 05/23/17 0406  WBC 17.6*  16.0* 12.4* 11.2* 9.9  NEUTROABS 13.5*  --   --   --   HGB 14.1  14.1 12.2* 11.7* 11.6*  HCT 41.0  42.2 36.1* 34.8* 35.0*  MCV 85.1  85.4 84.7 84.3 85.2  PLT 321  322 258 289 250   Basic Metabolic Panel: Recent Labs  Lab 05/20/17 1402 05/21/17 0157 05/22/17 0431 05/23/17 0406  NA 138 136  139 138  K 3.4* 3.4* 3.4* 4.6  CL 102 103 107 107  CO2 27 26 27 26   GLUCOSE 101* 91 105* 77  BUN 10 7 6 6   CREATININE 1.34* 1.13 1.22 1.19  CALCIUM 9.2 8.2* 8.5* 8.3*   GFR: Estimated Creatinine Clearance: 69.3 mL/min (by C-G formula based on SCr of 1.19 mg/dL). Liver Function Tests: Recent Labs  Lab 05/21/17 0157  AST 19  ALT 14*  ALKPHOS 83  BILITOT 1.0  PROT 6.8  ALBUMIN 3.2*   No results for input(s): LIPASE, AMYLASE in the last 168 hours. No results for input(s): AMMONIA in the last 168 hours. Coagulation Profile: No results for input(s): INR, PROTIME in the last 168 hours. Cardiac Enzymes: Recent Labs  Lab 05/20/17 2107 05/21/17 0157 05/21/17 0828  TROPONINI <0.03 <0.03 <0.03   BNP (last 3 results) No results for input(s): PROBNP in the last 8760 hours. HbA1C: Recent Labs    05/22/17 0431  HGBA1C 5.5   CBG: No results for input(s): GLUCAP in the last 168 hours. Lipid Profile: Recent Labs    05/21/17 0821 05/22/17 0431  CHOL 141 131  HDL 43 40*  LDLCALC 81 80  TRIG 85 54  CHOLHDL 3.3 3.3   Thyroid Function Tests:  No results for input(s): TSH, T4TOTAL, FREET4, T3FREE, THYROIDAB in the last 72 hours. Anemia Panel: No results for input(s): VITAMINB12, FOLATE, FERRITIN, TIBC, IRON, RETICCTPCT in the last 72 hours. Urine analysis: No results found for: COLORURINE, APPEARANCEUR, LABSPEC, PHURINE, GLUCOSEU, HGBUR, BILIRUBINUR, KETONESUR, PROTEINUR, UROBILINOGEN, NITRITE, LEUKOCYTESUR Recent Results (from the past 240 hour(s))  Culture, blood (Routine X 2) w Reflex to ID Panel     Status: None (Preliminary result)   Collection Time: 05/20/17  5:06 PM  Result Value Ref Range Status   Specimen Description BLOOD RIGHT ANTECUBITAL  Final   Special Requests   Final    BOTTLES DRAWN AEROBIC AND ANAEROBIC Blood Culture adequate volume   Culture   Final    NO GROWTH 2 DAYS Performed at [facility], [address] [place], [place] [address]     Report Status PENDING  Incomplete  Culture, blood (Routine X 2) w Reflex to ID Panel     Status: None (Preliminary result)   Collection Time: 05/20/17  6:50 PM  Result Value Ref Range Status   Specimen Description BLOOD BLOOD LEFT HAND  Final   Special Requests   Final    BOTTLES DRAWN AEROBIC AND ANAEROBIC Blood Culture adequate volume   Culture   Final    NO GROWTH 1 DAY Performed at [facility], [address] [place], [place] [address]    Report Status PENDING  Incomplete  Culture, blood (routine x 2) Call MD if unable to obtain prior to antibiotics being given     Status: None (Preliminary result)   Collection Time: 05/20/17  9:07 PM  Result Value Ref Range Status   Specimen Description BLOOD RIGHT HAND  Final   Special Requests AEROBIC BOTTLE ONLY Blood Culture adequate volume  Final   Culture   Final    NO GROWTH 1 DAY Performed at [facility], [address] [place], [place] [address]    Report Status PENDING  Incomplete  MRSA PCR Screening     Status: None   Collection Time: 05/21/17  9:10 AM  Result Value Ref Range Status   MRSA by PCR NEGATIVE NEGATIVE Final    Comment:        The GeneXpert MRSA Assay (FDA approved for NASAL specimens only), is one component of a comprehensive MRSA colonization surveillance program. It is not intended to diagnose MRSA infection nor to guide or monitor treatment for MRSA infections.   Culture, sputum-assessment     Status: None   Collection Time: 05/21/17  9:19 AM  Result Value Ref Range Status   Specimen Description SPUTUM  Final   Special Requests Normal  Final   Sputum evaluation THIS SPECIMEN IS ACCEPTABLE FOR SPUTUM CULTURE  Final   Report Status 05/21/2017 FINAL  Final  Culture, respiratory (NON-Expectorated)     Status: None (Preliminary result)   Collection Time: 05/21/17  9:19 AM  Result Value Ref Range Status   Specimen Description SPUTUM  Final   Special Requests Normal Reflexed from [id number]  Final     Gram Stain   Final    ABUNDANT WBC PRESENT,BOTH PMN AND MONONUCLEAR FEW SQUAMOUS EPITHELIAL CELLS PRESENT FEW GRAM POSITIVE RODS FEW GRAM POSITIVE COCCI IN PAIRS RARE GRAM NEGATIVE RODS    Culture   Final    CULTURE REINCUBATED FOR BETTER GROWTH Performed at [facility], [address] [place], [place] [address]    Report Status PENDING  Incomplete  Radiology Studies: Ct Soft Tissue Neck W Contrast  Result Date: 05/21/2017 CLINICAL DATA:  Dental pain and facial swelling for 1 month, follow-up LEFT mandibular abscess. EXAM: CT NECK WITH CONTRAST TECHNIQUE: Multidetector CT imaging of the neck was performed using the standard protocol following the bolus administration of intravenous contrast. CONTRAST:  75mL ISOVUE-300 IOPAMIDOL (ISOVUE-300) INJECTION 61% COMPARISON:  None. FINDINGS: PHARYNX AND LARYNX: Normal.  Widely patent airway. SALIVARY GLANDS: Normal. THYROID: Normal. LYMPH NODES: No lymphadenopathy by CT size criteria. VASCULAR: Abnormal hypodense soft tissue splaying the LEFT carotid bifurcation. LIMITED INTRACRANIAL: Normal. VISUALIZED ORBITS: Normal. MASTOIDS AND VISUALIZED PARANASAL SINUSES: Mild paranasal sinus mucosal thickening without air-fluid levels. SKELETON: Multiple dental caries. Large tooth 15, tooth 18 and tooth 29 periapical abscess. Severe C6-7 degenerative disc and moderate neural foraminal narrowing. UPPER CHEST: Patchy RIGHT upper lobe airspace opacities RIGHT upper lobe corresponding to nodular densities on yesterday's CT chest. OTHER: None. IMPRESSION: 1. Poor dentition with multiple dental caries and periapical abscess. No drainable fluid collection. 2. Abnormal soft tissue LEFT carotid space, differential diagnosis includes lymph node or complex cyst, atypical features for paraganglioma or schwannoma. Recommend contrast-enhanced MRI of the neck on nonemergent basis versus ultrasound. Electronically Signed   By: Awilda Metroourtnay  Bloomer M.D.   On: 05/21/2017  18:29    Scheduled Meds: . enoxaparin (LOVENOX) injection  40 mg Subcutaneous Q24H   Continuous Infusions: . azithromycin Stopped (05/22/17 1937)  . dextrose 5 % and 0.9 % NaCl with KCl 20 mEq/L 100 mL/hr at 05/23/17 0935  . piperacillin-tazobactam (ZOSYN)  IV Stopped (05/23/17 0900)     LOS: 3 days   Time spent: 25 minutes.  Hazeline Junkeryan Grunz, MD [facility] Pager [phone]  If 7PM-7AM, please contact night-coverage [email or web address] Password [email or web address] 05/23/2017, 11:48 AM

## 2017-05-24 ENCOUNTER — Telehealth: Payer: Self-pay | Admitting: Emergency Medicine

## 2017-05-24 DIAGNOSIS — R918 Other nonspecific abnormal finding of lung field: Secondary | ICD-10-CM | POA: Diagnosis not present

## 2017-05-24 DIAGNOSIS — K047 Periapical abscess without sinus: Secondary | ICD-10-CM | POA: Diagnosis not present

## 2017-05-24 DIAGNOSIS — R071 Chest pain on breathing: Secondary | ICD-10-CM | POA: Diagnosis not present

## 2017-05-24 DIAGNOSIS — R072 Precordial pain: Secondary | ICD-10-CM | POA: Diagnosis not present

## 2017-05-24 DIAGNOSIS — E876 Hypokalemia: Secondary | ICD-10-CM | POA: Diagnosis not present

## 2017-05-24 LAB — BASIC METABOLIC PANEL
ANION GAP: 4 — AB (ref 5–15)
BUN: 7 mg/dL (ref 6–20)
CALCIUM: 8.9 mg/dL (ref 8.9–10.3)
CHLORIDE: 107 mmol/L (ref 101–111)
CO2: 27 mmol/L (ref 22–32)
Creatinine, Ser: 1.07 mg/dL (ref 0.61–1.24)
GFR calc non Af Amer: >60 mL/min (ref >60)
GLUCOSE: 118 mg/dL — AB (ref 65–99)
POTASSIUM: 3.2 mmol/L — AB (ref 3.5–5.1)
Sodium: 138 mmol/L (ref 135–145)

## 2017-05-24 LAB — CBC
HEMATOCRIT: 36.9 % — AB (ref 39.0–52.0)
HEMOGLOBIN: 12.5 g/dL — AB (ref 13.0–17.0)
MCH: 28.6 pg (ref 26.0–34.0)
MCHC: 33.9 g/dL (ref 30.0–36.0)
MCV: 84.4 fL (ref 78.0–100.0)
Platelets: 355 10*3/uL (ref 150–400)
RBC: 4.37 MIL/uL (ref 4.22–5.81)
RDW: 13.3 % (ref 11.5–15.5)
WBC: 9.7 10*3/uL (ref 4.0–10.5)

## 2017-05-24 LAB — ACID FAST SMEAR (AFB, MYCOBACTERIA)

## 2017-05-24 LAB — ACID FAST SMEAR (AFB): ACID FAST SMEAR - AFSCU2: NEGATIVE

## 2017-05-24 MED ORDER — POTASSIUM CHLORIDE CRYS ER 20 MEQ PO TBCR
40.0000 meq | EXTENDED_RELEASE_TABLET | ORAL | Status: AC
  Administered 2017-05-24 (×2): 40 meq via ORAL
  Filled 2017-05-24 (×2): qty 2

## 2017-05-24 NOTE — Consult Note (Signed)
Patient name: Drew Waller Medical record number: [id number] Date of birth: Jan 16, 1961 Age: 56 y.o. Gender: male PCP: Golden Circle, FNP  Date: 05/24/2017 Reason for Consult: Pulmonary nodules  Referring Physician: Dr. Bonner Puna  HPI:  56 yo M with no known past medical history who presented to the [facility] ED on 12/16 with pleuritic left sided chest pain and cough x 2 days. EKG with non specific T wave changes, troponins were negative. Noted to have an elevated Cr 1.34 and leukocytosis of 16k.  UDS was positive for THC, opiates, and cocaine. CXR shwoed nodular densities and follow up CT with multiple bilateral nodules concerning for septic emboli vs. grandulomas vs metastatic disease. Work up thus far includes non reactive HIV ab, reassuring AFP & CEA, negative blood cultures to date, respiratory cultures consistent with normal flora, 1 of 3 AFB smears negative, and TTE without evidence of vegetations. Quantiferon gold assay pending. Currently on empiric broad spectrum zosyn and azithro (day 5) and airborne precautions.   Interval History:  Doing well this morning, very eager for discharge. Reports his cough and chest pain have improved with abx. Facial pain and swelling improving as well. Stated he would prefer to do the bronchoscopy as an outpatient if possible. No complaints.  History reviewed. No pertinent past medical history.  Past Surgical History:  Procedure Laterality Date  . TUMOR EXCISION      Family History  Problem Relation Age of Onset  . Heart attack Mother        at age 34  . Hypertension Mother   . Diabetes Mother   . Prostate cancer Father   . Hypertension Father   . Healthy Sister   . Healthy Brother   . Heart attack Maternal Grandmother        at age 73  . Hypertension Maternal Grandmother     Social History:  reports that  has never smoked. he has never used smokeless tobacco. He reports that he drinks about 4.2 oz of alcohol per week. He reports that he does not  use drugs.  Allergies: No Known Allergies  Medications: I have reviewed the patient's current medications.  Pertinent items are noted in HPI.  Temp:  [97.9 F (36.6 C)-98.9 F (37.2 C)] 98.1 F (36.7 C) (12/20 1950) Pulse Rate:  [80-90] 90 (12/20 0632) Resp:  [18] 18 (12/20 0632) BP: (134-182)/(84-95) 134/87 (12/20 0632) SpO2:  [100 %] 100 % (12/20 9326)  Intake/Output Summary (Last 24 hours) at 05/24/2017 0900 Last data filed at 05/24/2017 7124 Gross per 24 hour  Intake 830 ml  Output 0 ml  Net 830 ml   Physical exam  Constitutional: Sitting up on edge of bed, NAD, appears comfortable HEENT: Atraumatic, normocephalic. PERRL, anicteric sclera.  Neck: Supple, trachea midline.  Cardiovascular: RRR, no murmurs, rubs, or gallops.  Pulmonary/Chest: CTAB, no wheezes, rales, or rhonchi.  Abdominal: Soft, non tender, non distended. +BS.  Extremities: Warm and well perfused.  No edema.  Neurological: A&Ox3, CN II - XII grossly intact.  Skin: No rashes or erythema  Psychiatric: Normal mood and affect    LAB RESULTS BMET    Component Value Date/Time   NA 138 05/24/2017 0412   K 3.2 (L) 05/24/2017 0412   CL 107 05/24/2017 0412   CO2 27 05/24/2017 0412   GLUCOSE 118 (H) 05/24/2017 0412   BUN 7 05/24/2017 0412   CREATININE 1.07 05/24/2017 0412   CALCIUM 8.9 05/24/2017 0412   GFRNONAA >60 05/24/2017 0412   GFRAA >  60 05/24/2017 0412   CBC    Component Value Date/Time   WBC 9.7 05/24/2017 0412   RBC 4.37 05/24/2017 0412   HGB 12.5 (L) 05/24/2017 0412   HCT 36.9 (L) 05/24/2017 0412   PLT 355 05/24/2017 0412   MCV 84.4 05/24/2017 0412   MCH 28.6 05/24/2017 0412   MCHC 33.9 05/24/2017 0412   RDW 13.3 05/24/2017 0412   LYMPHSABS 2.1 05/20/2017 1402   MONOABS 1.9 (H) 05/20/2017 1402   EOSABS 0.0 05/20/2017 1402   BASOSABS 0.0 05/20/2017 1402   Radiology  05/20/17 CXR: IMPRESSION: Bilateral nodular densities are noted. Pulmonary nodules cannot be excluded. CT  chest is recommended.  05/20/17 CTA Chest: IMPRESSION: 1. Examination is negative for acute pulmonary emboli. 2. Multiple bilateral pulmonary nodules, at least 1 of which is cavitary in appearance. The appearance is nonspecific. Differential considerations include multiple septic emboli, granulomatous inflammation/infection or metastatic disease. Careful clinical correlation is advised.  05/21/17 CT Soft Tissue Neck: IMPRESSION: 1. Poor dentition with multiple dental caries and periapical abscess. No drainable fluid collection. 2. Abnormal soft tissue LEFT carotid space, differential diagnosis includes lymph node or complex cyst, atypical features for paraganglioma or schwannoma. Recommend contrast-enhanced MRI of the neck on nonemergent basis versus ultrasound.  Assessment and plan:  56 yo M with no known past medical history presented with chest pain and cough in the setting of dental soft tissue infection, found to have multiple bilateral pulmonary nodules on imaging.  Bilateral Pulmonary Nodules: Infectious work up negative thus far, cancer serologies normal.  Checking ANCA and anti-GBM antibodies.  - F/u AFB and quantiferon gold - Low probability of TB, can DC airborne once AFB smear negative x 3 - Will hold of on TEE for now, consider repeating blood cultures  - Recommend prolonged abx course with Augmentin 3-4 weeks and repeat CT scan in 6 weeks - Will arrange outpatient follow up with Dr. Lamonte Sakai to f/u CT results. If persistent, may need outpatient bronch for further work up.   Dental soft tissue infection: Improving, CT without drainable fluid collection.  - Continue abx  Velna Ochs, M.D. - PGY2 Pager: [phone] 05/24/2017, 9:11 AM   Attending Note:  I have examined patient, reviewed labs, studies and notes. I have discussed the case with Dr Philipp Ovens, and I agree with the data and plans as amended above.   56 year old man with dental infection without any  evidence of abscess by CT, presented with this and also with chest discomfort cough and dyspnea.  CT chest shows bilateral rounded pulmonary nodules of various sizes.  Appearance concerning for an embolic process-no evidence for metastatic disease based on screening serologies.  Consider embolic infection or some other nodular process, autoimmune, etc.  AFB and QuantiFERON gold are pending. We will order ANCA and anti-GBM antibodies today Blood cultures are negative, could consider repeating.  Echocardiogram reassuring.  Likely hold off on TEE unless his blood cultures become positive. At this point given his clinical improvement, as long as his AFB smears are negative, I would treat him with an extended course of antibiotics (likely Augmentin for 3-4 weeks) and then follow his CT scan of the chest to look for interval change.  If the nodules persist then I believe he needs bronchoscopy, BAL, possible biopsies.  We will also follow his serologies, ANCA, anti-GBM.  We will arrange for him to have a CT scan of the chest and follow-up appointment with Dr. Luevenia Maxin for DC on antibiotics if his TB  testing is negative.  Please call if we can assist further.  Baltazar Apo, MD, PhD 05/24/2017, 1:03 PM [facility] [phone] or if no answer [phone]

## 2017-05-24 NOTE — Plan of Care (Signed)
  Progressing Health Behavior/Discharge Planning: Ability to manage health-related needs will improve 05/24/2017 0819 - Progressing by Illeana Edick, Alben SpittleMary E, RN Clinical Measurements: Ability to maintain clinical measurements within normal limits will improve 05/24/2017 0819 - Progressing by Fanta Wimberley, Alben SpittleMary E, RN Will remain free from infection 05/24/2017 0819 - Progressing by Annakate Soulier, Alben SpittleMary E, RN Diagnostic test results will improve 05/24/2017 0819 - Progressing by Shaquoia Miers, Alben SpittleMary E, RN Respiratory complications will improve 05/24/2017 0819 - Progressing by Dayden Viverette, Alben SpittleMary E, RN Cardiovascular complication will be avoided 05/24/2017 [id number] - Progressing by Baneza Bartoszek, Alben SpittleMary E, RN

## 2017-05-24 NOTE — Telephone Encounter (Signed)
Drew Waller needs a hosp f/u visit with me in about 6 weeks Needs a SuperD Ct chest in 6 weeks, no contrast, for multiple pulmonary nodules, largest 1.5cm. Preferably to be done before our appointment.   Thanks a bunch

## 2017-05-24 NOTE — Progress Notes (Signed)
[facility]  PROGRESS NOTE  Drew Waller RUE:[id number] DOB: [birth year] DOA: 05/20/2017 PCP: Veryl Speak, FNP   Brief HPI:    Drew Waller is a 56 y.o. male with no medical follow up who presented to the ED with continued constant, severe left anterior chest pain/pressure radiating to the midchest worse with breathing and nonproductive cough for 2 days. 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 On evaluation chest was tender to palpation, RRR, lungs clear, abscess around left mandibular molars was noted. EKG with nonspecific T changesin anterolateral leads, troponin neg, mildly elevated Cr 1.34 (unclear baseline), and leukocytosis to 16k. UDS + THC, opiates and cocaine. CXR showed nonspecific nodular densities and subsequent CT chest confirmed multiple bilateral nodules, at least one with cavitary appearance. He was given aspirin, morphine, GI cocktail with improvement in pain. He was admitted for ACS rule out and further evaluation of pulmonary nodules with working DDx septic emboli, granulomatous or metastatic disease.       Subjective   Patient seen and examined, denies chest pain or shortness of breath.   Assessment/Plan:     1. Chest pain- resolved, likely atypical. More pleuritic/muscular skeletal. Cardiac enzymes 2 negative. Echocardiogram showed EF 60-65%, grade 1 diastolic dysfunction. 2. Pulmonary nodules and cavitary lung lesion- differential includes septic emboli in setting of dental abscess, no adenomatous or metastatic disease. HIV nonreactive, PSA, AFP and CEA are negative. WBC is showing improvement with antibiotics. Today WBC is 9.7. If the smear 1 is negative AFB culture is pending. Blood cultures are negative to date. Patient is on broad-spectrum antibiotics Zosyn and Zithromax. Quantity from is pending. Discussed with  Dr. Delton Coombes, patient wants to go home. Will await AFB smear, needs to be negative 2 before discharge home. 3. Dental periapical abscess- without drainable fluid collection on CT scan. Continue Zosyn. Patient can follow-up with dentist as outpatient. 4. Abnormal soft tissue in the left carotid space- seen on CT soft tissue neck, DDx includes LN, complex cyst; less morphologically consistent with paraganglioma or schwannoma. - Recommend MRI neck w/contrast nonemergently. Will defer to outpatient setting after resolution of infection. 5. Hypokalemia-potassium is 3.2 this morning, will give K-Dur 40 meq every 4 hours by mouth 2    DVT prophylaxis: Lovenox  Code Status: Full code  Family Communication: *No family present at bedside  Disposition Plan: home likely in 1-2 days   Consultants:  Pulmonary  Cardiology   Procedures:  Echo  Continuous infusions . azithromycin 500 mg (05/24/17 1733)  . piperacillin-tazobactam (ZOSYN)  IV Stopped (05/24/17 1528)      Antibiotics:   Anti-infectives (From admission, onward)   Start     Dose/Rate Route Frequency Ordered Stop   05/21/17 1800  vancomycin (VANCOCIN) 1,250 mg in sodium chloride 0.9 % 250 mL IVPB  Status:  Discontinued     1,250 mg 166.7 mL/hr over 90 Minutes Intravenous Every 24 hours 05/20/17 1842 05/21/17 1335   05/21/17 0400  piperacillin-tazobactam (ZOSYN) IVPB 3.375 g     3.375 g 12.5 mL/hr over 240 Minutes Intravenous Every 8 hours 05/20/17 1842     05/20/17 1915  vancomycin (VANCOCIN) 1,500 mg in sodium chloride 0.9 % 500 mL IVPB     1,500 mg 250 mL/hr over 120 Minutes Intravenous  Once 05/20/17 1842 05/20/17 2214   05/20/17 1915  piperacillin-tazobactam (ZOSYN) IVPB 3.375 g  3.375 g 100 mL/hr over 30 Minutes Intravenous  Once 05/20/17 1842 05/20/17 2245   05/20/17 1830  azithromycin (ZITHROMAX) 500 mg in dextrose 5 % 250 mL IVPB     500 mg 250 mL/hr over 60 Minutes Intravenous Every 24 hours 05/20/17 1823          Objective   Vitals:   05/23/17 2130 05/23/17 2353 05/24/17 0632 05/24/17 1441  BP: (!) 182/90 (!) 146/84 134/87 (!) 161/84  Pulse: 80  90 78  Resp:   18 16  Temp: 97.9 F (36.6 C)  98.1 F (36.7 C) 98.3 F (36.8 C)  TempSrc: Oral  Oral Oral  SpO2: 100%  100% 98%  Weight:      Height:        Intake/Output Summary (Last 24 hours) at 05/24/2017 1825 Last data filed at 05/24/2017 1528 Gross per 24 hour  Intake 870 ml  Output 0 ml  Net 870 ml   Filed Weights   05/20/17 1334 05/20/17 2037  Weight: 81.6 kg (180 lb) 76.9 kg (169 lb 8.5 oz)     Physical Examination:   Physical Exam: Eyes: No icterus, extraocular muscles intact  Mouth: Oral mucosa is moist, no lesions on palate,  Neck: Supple, no deformities, masses, or tenderness Lungs: Normal respiratory effort, bilateral clear to auscultation, no crackles or wheezes.  Heart: Regular rate and rhythm, S1 and S2 normal, no murmurs, rubs auscultated Abdomen: BS normoactive,soft,nondistended,non-tender to palpation,no organomegaly Extremities: No pretibial edema, no erythema, no cyanosis, no clubbing Neuro : Alert and oriented to time, place and person, No focal deficits  Skin: No rashes seen on exam     Data Reviewed: I have personally reviewed following labs and imaging studies  CBG: No results for input(s): GLUCAP in the last 168 hours.  CBC: Recent Labs  Lab 05/20/17 1402 05/21/17 0157 05/22/17 0431 05/23/17 0406 05/24/17 0412  WBC 17.6*  16.0* 12.4* 11.2* 9.9 9.7  NEUTROABS 13.5*  --   --   --   --   HGB 14.1  14.1 12.2* 11.7* 11.6* 12.5*  HCT 41.0  42.2 36.1* 34.8* 35.0* 36.9*  MCV 85.1  85.4 84.7 84.3 85.2 84.4  PLT 321  322 258 289 250 355    Basic Metabolic Panel: Recent Labs  Lab 05/20/17 1402 05/21/17 0157 05/22/17 0431 05/23/17 0406 05/24/17 0412  NA 138 136 139 138 138  K 3.4* 3.4* 3.4* 4.6 3.2*  CL 102 103 107 107 107  CO2 27 26 27 26 27   GLUCOSE 101* 91 105* 77 118*   BUN 10 7 6 6 7   CREATININE 1.34* 1.13 1.22 1.19 1.07  CALCIUM 9.2 8.2* 8.5* 8.3* 8.9    Recent Results (from the past 240 hour(s))  Culture, blood (Routine X 2) w Reflex to ID Panel     Status: None (Preliminary result)   Collection Time: 05/20/17  5:06 PM  Result Value Ref Range Status   Specimen Description BLOOD RIGHT ANTECUBITAL  Final   Special Requests   Final    BOTTLES DRAWN AEROBIC AND ANAEROBIC Blood Culture adequate volume   Culture   Final    NO GROWTH 4 DAYS Performed at [facility], [address] [place], [place] [address]    Report Status PENDING  Incomplete  Culture, blood (Routine X 2) w Reflex to ID Panel     Status: None (Preliminary result)   Collection Time: 05/20/17  6:50 PM  Result Value Ref Range Status  Specimen Description BLOOD BLOOD LEFT HAND  Final   Special Requests   Final    BOTTLES DRAWN AEROBIC AND ANAEROBIC Blood Culture adequate volume   Culture   Final    NO GROWTH 3 DAYS Performed at [facility], [address] [place], [place] [address]    Report Status PENDING  Incomplete  Culture, blood (routine x 2) Call MD if unable to obtain prior to antibiotics being given     Status: None (Preliminary result)   Collection Time: 05/20/17  9:07 PM  Result Value Ref Range Status   Specimen Description BLOOD RIGHT HAND  Final   Special Requests AEROBIC BOTTLE ONLY Blood Culture adequate volume  Final   Culture   Final    NO GROWTH 3 DAYS Performed at [facility], [address] [place], [place] [address]    Report Status PENDING  Incomplete  MRSA PCR Screening     Status: None   Collection Time: 05/21/17  9:10 AM  Result Value Ref Range Status   MRSA by PCR NEGATIVE NEGATIVE Final    Comment:        The GeneXpert MRSA Assay (FDA approved for NASAL specimens only), is one component of a comprehensive MRSA colonization surveillance program. It is not intended to diagnose MRSA infection nor to guide or monitor  treatment for MRSA infections.   Culture, sputum-assessment     Status: None   Collection Time: 05/21/17  9:19 AM  Result Value Ref Range Status   Specimen Description SPUTUM  Final   Special Requests Normal  Final   Sputum evaluation THIS SPECIMEN IS ACCEPTABLE FOR SPUTUM CULTURE  Final   Report Status 05/21/2017 FINAL  Final  Culture, respiratory (NON-Expectorated)     Status: None   Collection Time: 05/21/17  9:19 AM  Result Value Ref Range Status   Specimen Description SPUTUM  Final   Special Requests Normal Reflexed from [id number]  Final   Gram Stain   Final    ABUNDANT WBC PRESENT,BOTH PMN AND MONONUCLEAR FEW SQUAMOUS EPITHELIAL CELLS PRESENT FEW GRAM POSITIVE RODS FEW GRAM POSITIVE COCCI IN PAIRS RARE GRAM NEGATIVE RODS    Culture   Final    Consistent with normal respiratory flora. Performed at [facility], [address] [place], [place] [address]    Report Status 05/23/2017 FINAL  Final  Acid Fast Smear (AFB)     Status: None   Collection Time: 05/22/17  8:10 AM  Result Value Ref Range Status   AFB Specimen Processing Concentration  Final   Acid Fast Smear Negative  Final    Comment: (NOTE) Performed At: [facility] [address] [place], [place] [id number] Jolene SchimkeNagendra Sanjai MD FT:[id number]:[phone]    Source (AFB) SPUTUM  Final     Liver Function Tests: Recent Labs  Lab 05/21/17 0157  AST 19  ALT 14*  ALKPHOS 83  BILITOT 1.0  PROT 6.8  ALBUMIN 3.2*   No results for input(s): LIPASE, AMYLASE in the last 168 hours. No results for input(s): AMMONIA in the last 168 hours.  Cardiac Enzymes: Recent Labs  Lab 05/20/17 2107 05/21/17 0157 05/21/17 0828  TROPONINI <0.03 <0.03 <0.03   BNP (last 3 results) No results for input(s): BNP in the last 8760 hours.  ProBNP (last 3 results) No results for input(s): PROBNP in the last 8760 hours.    Studies: No results found.  Scheduled Meds: . enoxaparin (LOVENOX) injection  40 mg Subcutaneous Q24H  Time spent: 20 min  Meredeth Ide   [facility] Pager [phone]. If 7PM-7AM, please contact night-coverage at [email or web address], Office  [phone]  password [email or web address]  05/24/2017, 6:25 PM  LOS: 4 days

## 2017-05-24 NOTE — Telephone Encounter (Signed)
Attempted to contact pt. There was no answer and I could not leave a message due to his voicemail being full. Will try back. 

## 2017-05-25 DIAGNOSIS — K047 Periapical abscess without sinus: Secondary | ICD-10-CM | POA: Diagnosis not present

## 2017-05-25 DIAGNOSIS — E876 Hypokalemia: Secondary | ICD-10-CM | POA: Diagnosis not present

## 2017-05-25 DIAGNOSIS — R072 Precordial pain: Secondary | ICD-10-CM | POA: Diagnosis not present

## 2017-05-25 DIAGNOSIS — R071 Chest pain on breathing: Secondary | ICD-10-CM | POA: Diagnosis not present

## 2017-05-25 LAB — ACID FAST SMEAR (AFB, MYCOBACTERIA)

## 2017-05-25 LAB — CBC
HCT: 34.8 % — ABNORMAL LOW (ref 39.0–52.0)
Hemoglobin: 11.8 g/dL — ABNORMAL LOW (ref 13.0–17.0)
MCH: 28.4 pg (ref 26.0–34.0)
MCHC: 33.9 g/dL (ref 30.0–36.0)
MCV: 83.9 fL (ref 78.0–100.0)
PLATELETS: 415 10*3/uL — AB (ref 150–400)
RBC: 4.15 MIL/uL — AB (ref 4.22–5.81)
RDW: 13.4 % (ref 11.5–15.5)
WBC: 10.9 10*3/uL — ABNORMAL HIGH (ref 4.0–10.5)

## 2017-05-25 LAB — BASIC METABOLIC PANEL
Anion gap: 6 (ref 5–15)
BUN: 7 mg/dL (ref 6–20)
CHLORIDE: 109 mmol/L (ref 101–111)
CO2: 25 mmol/L (ref 22–32)
CREATININE: 1.17 mg/dL (ref 0.61–1.24)
Calcium: 8.9 mg/dL (ref 8.9–10.3)
GFR calc non Af Amer: >60 mL/min (ref >60)
GLUCOSE: 102 mg/dL — AB (ref 65–99)
Potassium: 3.5 mmol/L (ref 3.5–5.1)
Sodium: 140 mmol/L (ref 135–145)

## 2017-05-25 LAB — CULTURE, BLOOD (ROUTINE X 2)
Culture: NO GROWTH
SPECIAL REQUESTS: ADEQUATE

## 2017-05-25 LAB — MPO/PR-3 (ANCA) ANTIBODIES

## 2017-05-25 LAB — GLOMERULAR BASEMENT MEMBRANE ANTIBODIES: GBM Ab: 5 units (ref 0–20)

## 2017-05-25 LAB — ACID FAST SMEAR (AFB): ACID FAST SMEAR - AFSCU2: NEGATIVE

## 2017-05-25 MED ORDER — AMOXICILLIN-POT CLAVULANATE 875-125 MG PO TABS
1.0000 | ORAL_TABLET | Freq: Two times a day (BID) | ORAL | Status: DC
  Administered 2017-05-25 – 2017-05-26 (×2): 1 via ORAL
  Filled 2017-05-25 (×2): qty 1

## 2017-05-25 NOTE — Progress Notes (Addendum)
[facility]  PROGRESS NOTE  Drew Waller ZOX:[id number] DOB: [birth year] DOA: 05/20/2017 PCP: Veryl Speakalone, Jacinto D, FNP   Brief HPI:    Drew Waller is a 56 y.o. male with no medical follow up who presented to the ED with continued constant, severe left anterior chest pain/pressure radiating to the midchest worse with breathing and nonproductive cough for 2 days. 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 On evaluation chest was tender to palpation, RRR, lungs clear, abscess around left mandibular molars was noted. EKG with nonspecific T changesin anterolateral leads, troponin neg, mildly elevated Cr 1.34 (unclear baseline), and leukocytosis to 16k. UDS + THC, opiates and cocaine. CXR showed nonspecific nodular densities and subsequent CT chest confirmed multiple bilateral nodules, at least one with cavitary appearance. He was given aspirin, morphine, GI cocktail with improvement in pain. He was admitted for ACS rule out and further evaluation of pulmonary nodules with working DDx septic emboli, granulomatous or metastatic disease.     Subjective   Patient seen and examined, he go to go home. AFB smear 2 were negative. Third one is pending.   Assessment/Plan:     1. Chest pain- resolved, likely atypical. More pleuritic/muscular skeletal. Cardiac enzymes 2 negative. Echocardiogram showed EF 60-65%, grade 1 diastolic dysfunction.  2. Pulmonary nodules and cavitary lung lesion- differential includes septic emboli in setting of dental abscess, no adenomatous or metastatic disease. HIV nonreactive, PSA, AFP and CEA are negative. WBC is showing improvement with antibiotics. Today WBC is 9.7. If the smear 1 is negative AFB culture is pending. Blood cultures are negative to date. Patient is on broad-spectrum antibiotics Zosyn and Zithromax. Blood culture is  negative to date.. Discussed with Dr. Delton CoombesByrum, patient wants to go home. Will await AFB smear, needs to be negative 3  before discharge home. Dr. Delton CoombesByrum recommends to discharge on Augmentin for 4 weeks  3. Dental periapical abscess- without drainable fluid collection on CT scan. Continue Zosyn. Patient can follow-up with dentist as outpatient.  4. Abnormal soft tissue in the left carotid space- seen on CT soft tissue neck, DDx includes LN, complex cyst; less morphologically consistent with paraganglioma or schwannoma. - Recommend MRI neck w/contrast nonemergently. Will defer to outpatient setting after resolution of infection.  5. Hypokalemia-replete, today potassium is 3.5    DVT prophylaxis: Lovenox  Code Status: Full code  Family Communication: *No family present at bedside  Disposition Plan: home likely in 1-2 days   Consultants:  Pulmonary  Cardiology   Procedures:  Echo  Continuous infusions . azithromycin Stopped (05/24/17 1847)  . piperacillin-tazobactam (ZOSYN)  IV 3.375 g (05/25/17 1143)      Antibiotics:   Anti-infectives (From admission, onward)   Start     Dose/Rate Route Frequency Ordered Stop   05/21/17 1800  vancomycin (VANCOCIN) 1,250 mg in sodium chloride 0.9 % 250 mL IVPB  Status:  Discontinued     1,250 mg 166.7 mL/hr over 90 Minutes Intravenous Every 24 hours 05/20/17 1842 05/21/17 1335   05/21/17 0400  piperacillin-tazobactam (ZOSYN) IVPB 3.375 g     3.375 g 12.5 mL/hr over 240 Minutes Intravenous Every 8 hours 05/20/17 1842     05/20/17 1915  vancomycin (VANCOCIN) 1,500 mg in sodium chloride 0.9 % 500 mL IVPB     1,500 mg 250 mL/hr over 120 Minutes Intravenous  Once 05/20/17 1842 05/20/17 2214  05/20/17 1915  piperacillin-tazobactam (ZOSYN) IVPB 3.375 g     3.375 g 100 mL/hr over 30 Minutes Intravenous  Once 05/20/17 1842 05/20/17 2245   05/20/17 1830  azithromycin (ZITHROMAX) 500 mg in dextrose 5 % 250 mL IVPB     500 mg 250 mL/hr over 60  Minutes Intravenous Every 24 hours 05/20/17 1823         Objective   Vitals:   05/24/17 0632 05/24/17 1441 05/24/17 2054 05/25/17 0624  BP: 134/87 (!) 161/84 (!) 149/94 (!) 154/96  Pulse: 90 78 87 80  Resp: 18 16 18 18   Temp: 98.1 F (36.7 C) 98.3 F (36.8 C) 98.7 F (37.1 C) 97.8 F (36.6 C)  TempSrc: Oral Oral Oral Oral  SpO2: 100% 98% 100% 100%  Weight:      Height:        Intake/Output Summary (Last 24 hours) at 05/25/2017 1342 Last data filed at 05/25/2017 1610 Gross per 24 hour  Intake 450 ml  Output 2 ml  Net 448 ml   Filed Weights   05/20/17 1334 05/20/17 2037  Weight: 81.6 kg (180 lb) 76.9 kg (169 lb 8.5 oz)     Physical Examination:   Physical Exam: Eyes: No icterus, extraocular muscles intact  Mouth: Oral mucosa is moist, no lesions on palate,  Neck: Supple, no deformities, masses, or tenderness Lungs: Normal respiratory effort, bilateral clear to auscultation, no crackles or wheezes.  Heart: Regular rate and rhythm, S1 and S2 normal, no murmurs, rubs auscultated Abdomen: BS normoactive,soft,nondistended,non-tender to palpation,no organomegaly Extremities: No pretibial edema, no erythema, no cyanosis, no clubbing Neuro : Alert and oriented to time, place and person, No focal deficits      Data Reviewed: I have personally reviewed following labs and imaging studies  CBG: No results for input(s): GLUCAP in the last 168 hours.  CBC: Recent Labs  Lab 05/20/17 1402 05/21/17 0157 05/22/17 0431 05/23/17 0406 05/24/17 0412 05/25/17 0434  WBC 17.6*  16.0* 12.4* 11.2* 9.9 9.7 10.9*  NEUTROABS 13.5*  --   --   --   --   --   HGB 14.1  14.1 12.2* 11.7* 11.6* 12.5* 11.8*  HCT 41.0  42.2 36.1* 34.8* 35.0* 36.9* 34.8*  MCV 85.1  85.4 84.7 84.3 85.2 84.4 83.9  PLT 321  322 258 289 250 355 415*    Basic Metabolic Panel: Recent Labs  Lab 05/21/17 0157 05/22/17 0431 05/23/17 0406 05/24/17 0412 05/25/17 0434  NA 136 139 138 138 140  K  3.4* 3.4* 4.6 3.2* 3.5  CL 103 107 107 107 109  CO2 26 27 26 27 25   GLUCOSE 91 105* 77 118* 102*  BUN 7 6 6 7 7   CREATININE 1.13 1.22 1.19 1.07 1.17  CALCIUM 8.2* 8.5* 8.3* 8.9 8.9    Recent Results (from the past 240 hour(s))  Culture, blood (Routine X 2) w Reflex to ID Panel     Status: None (Preliminary result)   Collection Time: 05/20/17  5:06 PM  Result Value Ref Range Status   Specimen Description BLOOD RIGHT ANTECUBITAL  Final   Special Requests   Final    BOTTLES DRAWN AEROBIC AND ANAEROBIC Blood Culture adequate volume   Culture   Final    NO GROWTH 4 DAYS Performed at [facility], [address], [place], [place] [address]    Report Status PENDING  Incomplete  Culture, blood (Routine X 2) w Reflex to ID Panel  Status: None (Preliminary result)   Collection Time: 05/20/17  6:50 PM  Result Value Ref Range Status   Specimen Description BLOOD BLOOD LEFT HAND  Final   Special Requests   Final    BOTTLES DRAWN AEROBIC AND ANAEROBIC Blood Culture adequate volume   Culture   Final    NO GROWTH 3 DAYS Performed at [facility], [address] [place], [place] [address]    Report Status PENDING  Incomplete  Culture, blood (routine x 2) Call MD if unable to obtain prior to antibiotics being given     Status: None (Preliminary result)   Collection Time: 05/20/17  9:07 PM  Result Value Ref Range Status   Specimen Description BLOOD RIGHT HAND  Final   Special Requests AEROBIC BOTTLE ONLY Blood Culture adequate volume  Final   Culture   Final    NO GROWTH 3 DAYS Performed at [facility], [address] [place], [place] [address]    Report Status PENDING  Incomplete  MRSA PCR Screening     Status: None   Collection Time: 05/21/17  9:10 AM  Result Value Ref Range Status   MRSA by PCR NEGATIVE NEGATIVE Final    Comment:        The GeneXpert MRSA Assay (FDA approved for NASAL specimens only), is one component of a comprehensive MRSA  colonization surveillance program. It is not intended to diagnose MRSA infection nor to guide or monitor treatment for MRSA infections.   Culture, sputum-assessment     Status: None   Collection Time: 05/21/17  9:19 AM  Result Value Ref Range Status   Specimen Description SPUTUM  Final   Special Requests Normal  Final   Sputum evaluation THIS SPECIMEN IS ACCEPTABLE FOR SPUTUM CULTURE  Final   Report Status 05/21/2017 FINAL  Final  Acid Fast Smear (AFB)     Status: None   Collection Time: 05/21/17  9:19 AM  Result Value Ref Range Status   AFB Specimen Processing Concentration  Final   Acid Fast Smear Negative  Final    Comment: (NOTE) Performed At: [facility] [address] [place], [place] [id number] Jolene SchimkeNagendra Sanjai MD WG:[id number]:[phone]    Source (AFB) SPUTUM  Final  Culture, respiratory (NON-Expectorated)     Status: None   Collection Time: 05/21/17  9:19 AM  Result Value Ref Range Status   Specimen Description SPUTUM  Final   Special Requests Normal Reflexed from [id number]  Final   Gram Stain   Final    ABUNDANT WBC PRESENT,BOTH PMN AND MONONUCLEAR FEW SQUAMOUS EPITHELIAL CELLS PRESENT FEW GRAM POSITIVE RODS FEW GRAM POSITIVE COCCI IN PAIRS RARE GRAM NEGATIVE RODS    Culture   Final    Consistent with normal respiratory flora. Performed at [facility], [address] [place], [place] [address]    Report Status 05/23/2017 FINAL  Final  Acid Fast Smear (AFB)     Status: None   Collection Time: 05/22/17  8:10 AM  Result Value Ref Range Status   AFB Specimen Processing Concentration  Final   Acid Fast Smear Negative  Final    Comment: (NOTE) Performed At: [facility] [place] [address] [place], [place] [id number] Jolene SchimkeNagendra Sanjai MD UV:[id number]:[phone]    Source (AFB) SPUTUM  Final     Liver Function Tests: Recent Labs  Lab 05/21/17 0157  AST 19  ALT 14*  ALKPHOS 83  BILITOT 1.0  PROT 6.8  ALBUMIN 3.2*  No results for input(s): LIPASE, AMYLASE  in the last 168 hours. No results for input(s): AMMONIA in the last 168 hours.  Cardiac Enzymes: Recent Labs  Lab 05/20/17 2107 05/21/17 0157 05/21/17 0828  TROPONINI <0.03 <0.03 <0.03   BNP (last 3 results) No results for input(s): BNP in the last 8760 hours.  ProBNP (last 3 results) No results for input(s): PROBNP in the last 8760 hours.    Studies: No results found.  Scheduled Meds: . enoxaparin (LOVENOX) injection  40 mg Subcutaneous Q24H      Time spent: 20 min  Meredeth Ide   [facility] Pager [phone]. If 7PM-7AM, please contact night-coverage at [email or web address], Office  [phone]  password [email or web address]  05/25/2017, 1:42 PM  LOS: 5 days

## 2017-05-25 NOTE — Plan of Care (Signed)
Waiting on Quantiferon gold assay results.

## 2017-05-25 NOTE — Plan of Care (Signed)
  Progressing Health Behavior/Discharge Planning: Ability to manage health-related needs will improve 05/25/2017 0838 - Progressing by Tatyanna Cronk, Alben SpittleMary E, RN Clinical Measurements: Ability to maintain clinical measurements within normal limits will improve 05/25/2017 0838 - Progressing by Hurley Sobel, Alben SpittleMary E, RN Will remain free from infection 05/25/2017 [id number] - Progressing by Atlee Villers, Alben SpittleMary E, RN Diagnostic test results will improve 05/25/2017 [phone] - Progressing by Shawndell Varas, Alben SpittleMary E, RN Respiratory complications will improve 05/25/2017 0838 - Progressing by Dajah Fischman, Alben SpittleMary E, RN Cardiovascular complication will be avoided 05/25/2017 [id number] - Progressing by Harmoni Lucus, Alben SpittleMary E, RN

## 2017-05-26 DIAGNOSIS — R071 Chest pain on breathing: Secondary | ICD-10-CM | POA: Diagnosis not present

## 2017-05-26 DIAGNOSIS — D72829 Elevated white blood cell count, unspecified: Secondary | ICD-10-CM | POA: Diagnosis not present

## 2017-05-26 DIAGNOSIS — N289 Disorder of kidney and ureter, unspecified: Secondary | ICD-10-CM

## 2017-05-26 DIAGNOSIS — E876 Hypokalemia: Secondary | ICD-10-CM

## 2017-05-26 DIAGNOSIS — K047 Periapical abscess without sinus: Secondary | ICD-10-CM | POA: Diagnosis not present

## 2017-05-26 DIAGNOSIS — R918 Other nonspecific abnormal finding of lung field: Secondary | ICD-10-CM | POA: Diagnosis not present

## 2017-05-26 LAB — CULTURE, BLOOD (ROUTINE X 2)
CULTURE: NO GROWTH
Culture: NO GROWTH
Special Requests: ADEQUATE
Special Requests: ADEQUATE

## 2017-05-26 LAB — CBC
HCT: 36.3 % — ABNORMAL LOW (ref 39.0–52.0)
Hemoglobin: 12.1 g/dL — ABNORMAL LOW (ref 13.0–17.0)
MCH: 28.3 pg (ref 26.0–34.0)
MCHC: 33.3 g/dL (ref 30.0–36.0)
MCV: 84.8 fL (ref 78.0–100.0)
PLATELETS: 445 10*3/uL — AB (ref 150–400)
RBC: 4.28 MIL/uL (ref 4.22–5.81)
RDW: 13.5 % (ref 11.5–15.5)
WBC: 10.5 10*3/uL (ref 4.0–10.5)

## 2017-05-26 LAB — BASIC METABOLIC PANEL
Anion gap: 7 (ref 5–15)
BUN: 6 mg/dL (ref 6–20)
CHLORIDE: 109 mmol/L (ref 101–111)
CO2: 23 mmol/L (ref 22–32)
Calcium: 8.9 mg/dL (ref 8.9–10.3)
Creatinine, Ser: 1.02 mg/dL (ref 0.61–1.24)
GFR calc Af Amer: >60 mL/min (ref >60)
GFR calc non Af Amer: >60 mL/min (ref >60)
GLUCOSE: 88 mg/dL (ref 65–99)
Potassium: 3.7 mmol/L (ref 3.5–5.1)
SODIUM: 139 mmol/L (ref 135–145)

## 2017-05-26 MED ORDER — AMOXICILLIN-POT CLAVULANATE 875-125 MG PO TABS: 1.0000 | ORAL_TABLET | Freq: Two times a day (BID) | ORAL | 0 refills | Status: AC

## 2017-05-26 NOTE — Plan of Care (Signed)
  Completed/Met Health Behavior/Discharge Planning: Ability to manage health-related needs will improve 05/26/2017 0802 - Completed/Met by Dontrae Morini, Royetta Crochet, RN

## 2017-05-26 NOTE — Discharge Summary (Signed)
Physician Discharge Summary  Drew Waller ZOX:[id number] DOB: [birth year] DOA: 05/20/2017  PCP: Veryl Speak, FNP  Admit date: 05/20/2017 Discharge date: 05/26/2017  Admitted From: Home Disposition:  Home  Recommendations for Outpatient Follow-up:  1. Follow up with PCP in 1week 2. Follow-up with pulmonary/Dr. Delton Coombes 4-6 weeks with repeat CT chest noncontrast   Home Health: No Equipment/Devices: None  Discharge Condition: Stable CODE STATUS: Full Diet recommendation: Heart Healthy   Brief/Interim Summary: 56-year-old male with no past medical history presented with chest pain and cough.  UDS was positive for THC, opiates and cocaine chest showed multiple bilateral nodules, at least 1 with cavitary appearance.  Pulmonary evaluated the patient.  Sputum smear for AFB x3 have been negative.  Pulmonary has cleared the patient for discharge on oral Augmentin for 3-4 weeks.  Patient will need outpatient follow-up with pulmonary in 4-6 weeks with repeat CT scan of the chest noncontrast.  Discharge Diagnoses:  Principal Problem:   Chest pain Active Problems:   Leukocytosis   Hypokalemia   Renal insufficiency   Abnormal EKG   1. Chest pain- resolved, likely atypical. More pleuritic/muscular skeletal. Cardiac enzymes 2 negative. Echocardiogram showed EF 60-65%, grade 1 diastolic dysfunction.  2. Pulmonary nodules and cavitary lung lesion- HIV nonreactive, PSA, AFP and CEA are negative. Sputum for AFB x3 are negative.  Patient initially started on Zosyn and Zithromax.  Switched to oral Augmentin.  Pulmonary recommended 3-4 weeks of oral Augmentin.  Outpatient follow-up with pulmonary in 4-6 weeks CT scan of the chest noncontrast.  Cultures negative so far.  Pulmonary has cleared the patient for discharge .  3. Dental periapical abscess- without drainable fluid collection on CT scan. Patient can follow-up with dentist as outpatient.  Continue Augmentin  4. Abnormal soft tissue in  the left carotid space- seen on CT soft tissue neck, DDx includes LN, complex cyst; less morphologically consistent with paraganglioma or schwannoma. - Recommend MRI neck w/contrast nonemergently. Will defer to outpatient setting after resolution of infection.  5. Hypokalemia- replaced.  Resolved 6. Hypertension-outpatient follow-up with primary care provider.  Patient is not on home antihypertensives.      Discharge Instructions  Discharge Instructions    Ambulatory referral to Pulmonology   Complete by:  As directed    Follow up in 4-6 weeks as per Dr. Delton Coombes. Patient needs a repeat CT chest prior to appointment   Call MD for:  difficulty breathing, headache or visual disturbances   Complete by:  As directed    Call MD for:  extreme fatigue   Complete by:  As directed    Call MD for:  hives   Complete by:  As directed    Call MD for:  persistant dizziness or light-headedness   Complete by:  As directed    Call MD for:  persistant nausea and vomiting   Complete by:  As directed    Call MD for:  severe uncontrolled pain   Complete by:  As directed    Call MD for:  temperature >100.4   Complete by:  As directed    Diet - low sodium heart healthy   Complete by:  As directed    Increase activity slowly   Complete by:  As directed      Allergies as of 05/26/2017   No Known Allergies     Medication List    STOP taking these medications   ciprofloxacin 500 MG tablet Commonly known as:  CIPRO   doxycycline 100 MG  capsule Commonly known as:  VIBRAMYCIN   HYDROcodone-acetaminophen 5-325 MG tablet Commonly known as:  NORCO/VICODIN     TAKE these medications   amoxicillin-clavulanate 875-125 MG tablet Commonly known as:  AUGMENTIN Take 1 tablet by mouth every 12 (twelve) hours.      Follow-up Information    Veryl Speakalone, Dariyon D, FNP Follow up in 1 week(s).   Specialties:  Family Medicine, Infectious Diseases Contact information: [address] [place]  [id number] [phone]        Leslye PeerByrum, Robert S, MD. Schedule an appointment as soon as possible for a visit in 4 week(s).   Specialty:  Pulmonary Disease Contact information: [address] [place] [address] [phone]          No Known Allergies  Consultations: Pulmonary Cardiology  Procedures/Studies: Dg Chest 2 View  Result Date: 05/20/2017 CLINICAL DATA:  Chest pain EXAM: CHEST  2 VIEW COMPARISON:  None. FINDINGS: There are nodular densities in both lungs. These range in size from 6-10 mm. Heart is normal in size. No pneumothorax or pleural effusion. IMPRESSION: Bilateral nodular densities are noted. Pulmonary nodules cannot be excluded. CT chest is recommended. Electronically Signed   By: Jolaine ClickArthur  Hoss M.D.   On: 05/20/2017 14:45   Ct Soft Tissue Neck W Contrast  Result Date: 05/21/2017 CLINICAL DATA:  Dental pain and facial swelling for 1 month, follow-up LEFT mandibular abscess. EXAM: CT NECK WITH CONTRAST TECHNIQUE: Multidetector CT imaging of the neck was performed using the standard protocol following the bolus administration of intravenous contrast. CONTRAST:  75mL ISOVUE-300 IOPAMIDOL (ISOVUE-300) INJECTION 61% COMPARISON:  None. FINDINGS: PHARYNX AND LARYNX: Normal.  Widely patent airway. SALIVARY GLANDS: Normal. THYROID: Normal. LYMPH NODES: No lymphadenopathy by CT size criteria. VASCULAR: Abnormal hypodense soft tissue splaying the LEFT carotid bifurcation. LIMITED INTRACRANIAL: Normal. VISUALIZED ORBITS: Normal. MASTOIDS AND VISUALIZED PARANASAL SINUSES: Mild paranasal sinus mucosal thickening without air-fluid levels. SKELETON: Multiple dental caries. Large tooth 15, tooth 18 and tooth 29 periapical abscess. Severe C6-7 degenerative disc and moderate neural foraminal narrowing. UPPER CHEST: Patchy RIGHT upper lobe airspace opacities RIGHT upper lobe corresponding to nodular densities on yesterday's CT chest. OTHER: None. IMPRESSION: 1. Poor dentition with multiple  dental caries and periapical abscess. No drainable fluid collection. 2. Abnormal soft tissue LEFT carotid space, differential diagnosis includes lymph node or complex cyst, atypical features for paraganglioma or schwannoma. Recommend contrast-enhanced MRI of the neck on nonemergent basis versus ultrasound. Electronically Signed   By: Awilda Metroourtnay  Bloomer M.D.   On: 05/21/2017 18:29   Ct Angio Chest Pe W And/or Wo Contrast  Result Date: 05/20/2017 CLINICAL DATA:  Evaluate for acute pulmonary embolus. EXAM: CT ANGIOGRAPHY CHEST WITH CONTRAST TECHNIQUE: Multidetector CT imaging of the chest was performed using the standard protocol during bolus administration of intravenous contrast. Multiplanar CT image reconstructions and MIPs were obtained to evaluate the vascular anatomy. CONTRAST:  100mL ISOVUE-370 IOPAMIDOL (ISOVUE-370) INJECTION 76% COMPARISON:  Chest radiograph 05/20/2017 FINDINGS: Cardiovascular: Satisfactory opacification of the pulmonary arteries to the segmental level. No evidence of pulmonary embolism. Normal heart size. No pericardial effusion. Mediastinum/Nodes: No enlarged mediastinal, hilar, or axillary lymph nodes. Thyroid gland, trachea, and esophagus demonstrate no significant findings. Lungs/Pleura: Multiple bilateral pulmonary nodules noted. At least 1 of these has a central cavitary component. Index cavitary nodule in the right upper lobe measures 11 mm, image 53 of series 6. Index solid nodule within the posteromedial subpleural left lower lobe measures 1.5 cm, image 91 of series 6. Solid  subpleural nodule in the left upper lobe measures 9 mm, image 61 of series 6. Upper Abdomen: No acute abnormality. 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 No aggressive lytic or sclerotic bone lesions. Review of the MIP images confirms the above findings. IMPRESSION: 1. Examination is negative for acute pulmonary emboli. 2. Multiple bilateral pulmonary nodules, at  least 1 of which is cavitary in appearance. The appearance is nonspecific. Differential considerations include multiple septic emboli, granulomatous inflammation/infection or metastatic disease. Careful clinical correlation is advised. Electronically Signed   By: Signa Kell M.D.   On: 05/20/2017 16:16    Echo on 05/22/2017 Study Conclusions  - Left ventricle: The cavity size was normal. Wall thickness was   increased in a pattern of mild LVH. Systolic function was normal.   The estimated ejection fraction was in the range of 60% to 65%.   Wall motion was normal; there were no regional wall motion   abnormalities. Doppler parameters are consistent with abnormal   left ventricular relaxation (grade 1 diastolic dysfunction). The   E/e&' ratio is between 8-15, suggesting indeterminate LV filling   pressure. - Left atrium: The atrium was normal in size. - Inferior vena cava: The vessel was normal in size. The   respirophasic diameter changes were in the normal range (>= 50%),   consistent with normal central venous pressure.  Impressions:  - LVEF 60-65%, mild LVH, normal wall motion, grade 1 DD,   indeterminate LV filling pressure, normal LA size, normal IVC, no   evidence for endocarditis.   Subjective: Patient seen and examined at bedside.  He denies any overnight fever, nausea or vomiting.  He wants to go home.  Discharge Exam: Vitals:   05/25/17 2053 05/26/17 0629  BP: (!) 152/93 (!) 143/93  Pulse: 87 93  Resp: 16 18  Temp: 97.8 F (36.6 C) 97.6 F (36.4 C)  SpO2: 99% 99%   Vitals:   05/25/17 0624 05/25/17 1438 05/25/17 2053 05/26/17 0629  BP: (!) 154/96 (!) 160/102 (!) 152/93 (!) 143/93  Pulse: 80 75 87 93  Resp: 18 20 16 18   Temp: 97.8 F (36.6 C) 97.8 F (36.6 C) 97.8 F (36.6 C) 97.6 F (36.4 C)  TempSrc: Oral Oral Oral Oral  SpO2: 100% 100% 99% 99%  Weight:      Height:        General: Pt is alert, awake, not in acute distress Cardiovascular: Rate  controlled, S1/S2 +,  Respiratory: Bilateral decreased breath sounds at bases with some scattered crackles Abdominal: Soft, NT, ND, bowel sounds + Extremities: no edema, no cyanosis    The results of significant diagnostics from this hospitalization (including imaging, microbiology, ancillary and laboratory) are listed below for reference.     Microbiology: Recent Results (from the past 240 hour(s))  Culture, blood (Routine X 2) w Reflex to ID Panel     Status: None   Collection Time: 05/20/17  5:06 PM  Result Value Ref Range Status   Specimen Description BLOOD RIGHT ANTECUBITAL  Final   Special Requests   Final    BOTTLES DRAWN AEROBIC AND ANAEROBIC Blood Culture adequate volume   Culture   Final    NO GROWTH 5 DAYS Performed at [facility], [address], [place], [place] [address]    Report Status 05/25/2017 FINAL  Final  Culture, blood (Routine X 2) w Reflex to ID Panel     Status: None (Preliminary result)   Collection Time: 05/20/17  6:50 PM  Result Value Ref Range Status   Specimen Description BLOOD BLOOD LEFT HAND  Final   Special Requests   Final    BOTTLES DRAWN AEROBIC AND ANAEROBIC Blood Culture adequate volume   Culture   Final    NO GROWTH 4 DAYS Performed at [facility], [address] [place], [place] [address]    Report Status PENDING  Incomplete  Culture, blood (routine x 2) Call MD if unable to obtain prior to antibiotics being given     Status: None (Preliminary result)   Collection Time: 05/20/17  9:07 PM  Result Value Ref Range Status   Specimen Description BLOOD RIGHT HAND  Final   Special Requests AEROBIC BOTTLE ONLY Blood Culture adequate volume  Final   Culture   Final    NO GROWTH 4 DAYS Performed at [facility], [address] [place], [place] [address]    Report Status PENDING  Incomplete  MRSA PCR Screening     Status: None   Collection Time: 05/21/17  9:10 AM  Result Value Ref Range Status   MRSA by PCR NEGATIVE  NEGATIVE Final    Comment:        The GeneXpert MRSA Assay (FDA approved for NASAL specimens only), is one component of a comprehensive MRSA colonization surveillance program. It is not intended to diagnose MRSA infection nor to guide or monitor treatment for MRSA infections.   Culture, sputum-assessment     Status: None   Collection Time: 05/21/17  9:19 AM  Result Value Ref Range Status   Specimen Description SPUTUM  Final   Special Requests Normal  Final   Sputum evaluation THIS SPECIMEN IS ACCEPTABLE FOR SPUTUM CULTURE  Final   Report Status 05/21/2017 FINAL  Final  Acid Fast Smear (AFB)     Status: None   Collection Time: 05/21/17  9:19 AM  Result Value Ref Range Status   AFB Specimen Processing Concentration  Final   Acid Fast Smear Negative  Final    Comment: (NOTE) Performed At: [facility] [address] [place], [place] [id number] Jolene Schimke MD WG:[id number]    Source (AFB) SPUTUM  Final  Culture, respiratory (NON-Expectorated)     Status: None   Collection Time: 05/21/17  9:19 AM  Result Value Ref Range Status   Specimen Description SPUTUM  Final   Special Requests Normal Reflexed from [id number]  Final   Gram Stain   Final    ABUNDANT WBC PRESENT,BOTH PMN AND MONONUCLEAR FEW SQUAMOUS EPITHELIAL CELLS PRESENT FEW GRAM POSITIVE RODS FEW GRAM POSITIVE COCCI IN PAIRS RARE GRAM NEGATIVE RODS    Culture   Final    Consistent with normal respiratory flora. Performed at [facility], [address] [place], [place] [address]    Report Status 05/23/2017 FINAL  Final  Acid Fast Smear (AFB)     Status: None   Collection Time: 05/22/17  8:10 AM  Result Value Ref Range Status   AFB Specimen Processing Concentration  Final   Acid Fast Smear Negative  Final    Comment: (NOTE) Performed At: [facility] [address] [place], [place] [id number] Jolene Schimke MD UV:[id number]    Source (AFB) SPUTUM  Final  Acid Fast Smear (AFB)     Status:  None   Collection Time: 05/24/17  9:14 AM  Result Value Ref Range Status   AFB Specimen Processing Concentration  Final   Acid Fast Smear Negative  Final  Comment: (NOTE) Performed At: [facility] [address] [place], [place] [id number] Jolene Schimke MD WU:[id number]    Source (AFB) SPUTUM  Final     Labs: BNP (last 3 results) No results for input(s): BNP in the last 8760 hours. Basic Metabolic Panel: Recent Labs  Lab 05/22/17 0431 05/23/17 0406 05/24/17 0412 05/25/17 0434 05/26/17 0527  NA 139 138 138 140 139  K 3.4* 4.6 3.2* 3.5 3.7  CL 107 107 107 109 109  CO2 27 26 27 25 23   GLUCOSE 105* 77 118* 102* 88  BUN 6 6 7 7 6   CREATININE 1.22 1.19 1.07 1.17 1.02  CALCIUM 8.5* 8.3* 8.9 8.9 8.9   Liver Function Tests: Recent Labs  Lab 05/21/17 0157  AST 19  ALT 14*  ALKPHOS 83  BILITOT 1.0  PROT 6.8  ALBUMIN 3.2*   No results for input(s): LIPASE, AMYLASE in the last 168 hours. No results for input(s): AMMONIA in the last 168 hours. CBC: Recent Labs  Lab 05/20/17 1402  05/22/17 0431 05/23/17 0406 05/24/17 0412 05/25/17 0434 05/26/17 0527  WBC 17.6*  16.0*   < > 11.2* 9.9 9.7 10.9* 10.5  NEUTROABS 13.5*  --   --   --   --   --   --   HGB 14.1  14.1   < > 11.7* 11.6* 12.5* 11.8* 12.1*  HCT 41.0  42.2   < > 34.8* 35.0* 36.9* 34.8* 36.3*  MCV 85.1  85.4   < > 84.3 85.2 84.4 83.9 84.8  PLT 321  322   < > 289 250 355 415* 445*   < > = values in this interval not displayed.   Cardiac Enzymes: Recent Labs  Lab 05/20/17 2107 05/21/17 0157 05/21/17 0828  TROPONINI <0.03 <0.03 <0.03   BNP: Invalid input(s): POCBNP CBG: No results for input(s): GLUCAP in the last 168 hours. D-Dimer No results for input(s): DDIMER in the last 72 hours. Hgb A1c No results for input(s): HGBA1C in the last 72 hours. Lipid Profile No results for input(s): CHOL, HDL, LDLCALC, TRIG, CHOLHDL, LDLDIRECT in the last 72 hours. Thyroid function studies No  results for input(s): TSH, T4TOTAL, T3FREE, THYROIDAB in the last 72 hours.  Invalid input(s): FREET3 Anemia work up No results for input(s): VITAMINB12, FOLATE, FERRITIN, TIBC, IRON, RETICCTPCT in the last 72 hours. Urinalysis No results found for: COLORURINE, APPEARANCEUR, LABSPEC, PHURINE, GLUCOSEU, HGBUR, BILIRUBINUR, KETONESUR, PROTEINUR, UROBILINOGEN, NITRITE, LEUKOCYTESUR Sepsis Labs Invalid input(s): PROCALCITONIN,  WBC,  LACTICIDVEN Microbiology Recent Results (from the past 240 hour(s))  Culture, blood (Routine X 2) w Reflex to ID Panel     Status: None   Collection Time: 05/20/17  5:06 PM  Result Value Ref Range Status   Specimen Description BLOOD RIGHT ANTECUBITAL  Final   Special Requests   Final    BOTTLES DRAWN AEROBIC AND ANAEROBIC Blood Culture adequate volume   Culture   Final    NO GROWTH 5 DAYS Performed at [facility], [address] [place], [place] [address]    Report Status 05/25/2017 FINAL  Final  Culture, blood (Routine X 2) w Reflex to ID Panel     Status: None (Preliminary result)   Collection Time: 05/20/17  6:50 PM  Result Value Ref Range Status   Specimen Description BLOOD BLOOD LEFT HAND  Final   Special Requests   Final    BOTTLES DRAWN AEROBIC AND ANAEROBIC Blood Culture adequate volume   Culture   Final  NO GROWTH 4 DAYS Performed at [facility], [address] [place], [place] [address]    Report Status PENDING  Incomplete  Culture, blood (routine x 2) Call MD if unable to obtain prior to antibiotics being given     Status: None (Preliminary result)   Collection Time: 05/20/17  9:07 PM  Result Value Ref Range Status   Specimen Description BLOOD RIGHT HAND  Final   Special Requests AEROBIC BOTTLE ONLY Blood Culture adequate volume  Final   Culture   Final    NO GROWTH 4 DAYS Performed at [facility], [address] [place], [place] [address]    Report Status PENDING  Incomplete  MRSA PCR Screening     Status: None    Collection Time: 05/21/17  9:10 AM  Result Value Ref Range Status   MRSA by PCR NEGATIVE NEGATIVE Final    Comment:        The GeneXpert MRSA Assay (FDA approved for NASAL specimens only), is one component of a comprehensive MRSA colonization surveillance program. It is not intended to diagnose MRSA infection nor to guide or monitor treatment for MRSA infections.   Culture, sputum-assessment     Status: None   Collection Time: 05/21/17  9:19 AM  Result Value Ref Range Status   Specimen Description SPUTUM  Final   Special Requests Normal  Final   Sputum evaluation THIS SPECIMEN IS ACCEPTABLE FOR SPUTUM CULTURE  Final   Report Status 05/21/2017 FINAL  Final  Acid Fast Smear (AFB)     Status: None   Collection Time: 05/21/17  9:19 AM  Result Value Ref Range Status   AFB Specimen Processing Concentration  Final   Acid Fast Smear Negative  Final    Comment: (NOTE) Performed At: [facility] [address] [place], [place] [id number] Jolene SchimkeNagendra Sanjai MD QM:[id number]:[phone]    Source (AFB) SPUTUM  Final  Culture, respiratory (NON-Expectorated)     Status: None   Collection Time: 05/21/17  9:19 AM  Result Value Ref Range Status   Specimen Description SPUTUM  Final   Special Requests Normal Reflexed from [id number]  Final   Gram Stain   Final    ABUNDANT WBC PRESENT,BOTH PMN AND MONONUCLEAR FEW SQUAMOUS EPITHELIAL CELLS PRESENT FEW GRAM POSITIVE RODS FEW GRAM POSITIVE COCCI IN PAIRS RARE GRAM NEGATIVE RODS    Culture   Final    Consistent with normal respiratory flora. Performed at [facility], [address] [place], [place] [address]    Report Status 05/23/2017 FINAL  Final  Acid Fast Smear (AFB)     Status: None   Collection Time: 05/22/17  8:10 AM  Result Value Ref Range Status   AFB Specimen Processing Concentration  Final   Acid Fast Smear Negative  Final    Comment: (NOTE) Performed At: [facility] [place] [address] [place], [place]  [id number] Jolene SchimkeNagendra Sanjai MD IH:[id number]:[phone]    Source (AFB) SPUTUM  Final  Acid Fast Smear (AFB)     Status: None   Collection Time: 05/24/17  9:14 AM  Result Value Ref Range Status   AFB Specimen Processing Concentration  Final   Acid Fast Smear Negative  Final    Comment: (NOTE) Performed At: [facility] [place] [address] [place], [place] [id number] Jolene SchimkeNagendra Sanjai MD JO:[id number]:[phone]    Source (AFB) SPUTUM  Final     Time coordinating discharge: 35 minutes  SIGNED:   Glade LloydKshitiz Markeda Narvaez, MD  [facility]  05/26/2017, 10:12 AM Pager: [phone]  If 7PM-7AM, please contact night-coverage [email or web address] Password [email or web address]

## 2017-05-28 LAB — QUANTIFERON-TB GOLD PLUS (RQFGPL)
QuantiFERON Mitogen Value: 4.59 IU/mL
QuantiFERON Nil Value: 0.05 IU/mL
QuantiFERON TB1 Ag Value: 0.03 IU/mL
QuantiFERON TB2 Ag Value: 0.03 IU/mL

## 2017-05-28 LAB — QUANTIFERON-TB GOLD PLUS: QUANTIFERON-TB GOLD PLUS: NEGATIVE

## 2017-05-31 ENCOUNTER — Other Ambulatory Visit: Payer: Self-pay | Admitting: Emergency Medicine

## 2017-05-31 DIAGNOSIS — R918 Other nonspecific abnormal finding of lung field: Secondary | ICD-10-CM

## 2017-05-31 MED FILL — AMOX-CLAV 875-125 MG TABLET: 875-125 | 30 days supply | Qty: 60 | Fill #0

## 2017-05-31 NOTE — Telephone Encounter (Signed)
Called and spoke with pt and he is aware of appt for the HFU to see RB on 1/30 at 145.  Order for the ct super d has been placed.

## 2017-06-29 ENCOUNTER — Other Ambulatory Visit: Payer: Self-pay | Admitting: Internal Medicine

## 2017-07-02 ENCOUNTER — Inpatient Hospital Stay: Admission: RE | Admit: 2017-07-02 | Payer: 59 | Source: Ambulatory Visit

## 2017-07-02 ENCOUNTER — Telehealth: Payer: Self-pay | Admitting: Emergency Medicine

## 2017-07-02 NOTE — Telephone Encounter (Signed)
ATC pt, no answer. Left message for pt to call back.  RB FYI, pt did not show for his CT scan. Do you still want him to keep his appt on 1/31?

## 2017-07-03 NOTE — Telephone Encounter (Signed)
He needs to keep the appointment if possible

## 2017-07-03 NOTE — Telephone Encounter (Signed)
ATC pt, no answer. Left message for pt to call back.  

## 2017-07-04 ENCOUNTER — Inpatient Hospital Stay: Payer: 59 | Admitting: Emergency Medicine

## 2017-07-04 NOTE — Telephone Encounter (Signed)
Pt currently does still have his appt scheduled with RB at 1:45.  Called pt making sure that he was still going to be coming for that appt but no answer from pt.  Left message with pt to verify that he will still be here.

## 2017-07-05 LAB — ACID FAST CULTURE WITH REFLEXED SENSITIVITIES: ACID FAST CULTURE - AFSCU3: NEGATIVE

## 2017-07-05 NOTE — Telephone Encounter (Signed)
Pt's appt was supposed to have been 07/04/17 with RB but no showed for that appt.  Will go ahead and close this encounter

## 2017-07-06 LAB — ACID FAST CULTURE WITH REFLEXED SENSITIVITIES: ACID FAST CULTURE - AFSCU3: NEGATIVE

## 2017-07-06 LAB — ACID FAST CULTURE WITH REFLEXED SENSITIVITIES (MYCOBACTERIA)

## 2017-07-20 LAB — ACID FAST CULTURE WITH REFLEXED SENSITIVITIES: ACID FAST CULTURE - AFSCU3: NEGATIVE

## 2017-07-20 LAB — ACID FAST CULTURE WITH REFLEXED SENSITIVITIES (MYCOBACTERIA)

## 2019-03-14 ENCOUNTER — Other Ambulatory Visit: Payer: Self-pay | Admitting: Emergency Medicine

## 2019-03-14 DIAGNOSIS — Z20822 Contact with and (suspected) exposure to covid-19: Secondary | ICD-10-CM

## 2019-03-16 LAB — NOVEL CORONAVIRUS, NAA: SARS-CoV-2, NAA: NOT DETECTED

## 2019-08-14 ENCOUNTER — Ambulatory Visit: Payer: Self-pay | Attending: Internal Medicine

## 2021-05-25 ENCOUNTER — Encounter (HOSPITAL_COMMUNITY): Payer: Self-pay

## 2021-05-25 ENCOUNTER — Other Ambulatory Visit: Payer: Self-pay

## 2021-05-25 ENCOUNTER — Emergency Department (HOSPITAL_BASED_OUTPATIENT_CLINIC_OR_DEPARTMENT_OTHER): Payer: Self-pay

## 2021-05-25 ENCOUNTER — Emergency Department (HOSPITAL_COMMUNITY): Payer: Self-pay

## 2021-05-25 ENCOUNTER — Inpatient Hospital Stay (HOSPITAL_COMMUNITY)
Admission: EM | Admit: 2021-05-25 | Discharge: 2021-05-27 | DRG: 065 | Disposition: A | Discharge status: Home / Self Care | Payer: Self-pay | Attending: Internal Medicine | Admitting: Internal Medicine

## 2021-05-25 DIAGNOSIS — Z20822 Contact with and (suspected) exposure to covid-19: Secondary | ICD-10-CM | POA: Diagnosis present

## 2021-05-25 DIAGNOSIS — I6389 Other cerebral infarction: Secondary | ICD-10-CM

## 2021-05-25 DIAGNOSIS — E785 Hyperlipidemia, unspecified: Secondary | ICD-10-CM | POA: Diagnosis present

## 2021-05-25 DIAGNOSIS — Z8249 Family history of ischemic heart disease and other diseases of the circulatory system: Secondary | ICD-10-CM

## 2021-05-25 DIAGNOSIS — I639 Cerebral infarction, unspecified: Secondary | ICD-10-CM | POA: Diagnosis present

## 2021-05-25 DIAGNOSIS — R29810 Facial weakness: Secondary | ICD-10-CM | POA: Diagnosis present

## 2021-05-25 DIAGNOSIS — D6489 Other specified anemias: Secondary | ICD-10-CM | POA: Diagnosis present

## 2021-05-25 DIAGNOSIS — Z8042 Family history of malignant neoplasm of prostate: Secondary | ICD-10-CM

## 2021-05-25 DIAGNOSIS — I10 Essential (primary) hypertension: Secondary | ICD-10-CM | POA: Diagnosis present

## 2021-05-25 DIAGNOSIS — I63541 Cerebral infarction due to unspecified occlusion or stenosis of right cerebellar artery: Principal | ICD-10-CM | POA: Diagnosis present

## 2021-05-25 DIAGNOSIS — R29701 NIHSS score 1: Secondary | ICD-10-CM | POA: Diagnosis present

## 2021-05-25 DIAGNOSIS — R2990 Unspecified symptoms and signs involving the nervous system: Secondary | ICD-10-CM

## 2021-05-25 DIAGNOSIS — Z833 Family history of diabetes mellitus: Secondary | ICD-10-CM

## 2021-05-25 DIAGNOSIS — G8194 Hemiplegia, unspecified affecting left nondominant side: Secondary | ICD-10-CM | POA: Diagnosis present

## 2021-05-25 DIAGNOSIS — R4781 Slurred speech: Secondary | ICD-10-CM | POA: Diagnosis present

## 2021-05-25 LAB — I-STAT CHEM 8, ED
BUN: 11 mg/dL (ref 6–20)
Calcium, Ion: 1.05 mmol/L — ABNORMAL LOW (ref 1.15–1.40)
Chloride: 110 mmol/L (ref 98–111)
Creatinine, Ser: 1 mg/dL (ref 0.61–1.24)
Glucose, Bld: 83 mg/dL (ref 70–99)
HCT: 36 % — ABNORMAL LOW (ref 39.0–52.0)
Hemoglobin: 12.2 g/dL — ABNORMAL LOW (ref 13.0–17.0)
Potassium: 3.6 mmol/L (ref 3.5–5.1)
Sodium: 144 mmol/L (ref 135–145)
TCO2: 23 mmol/L (ref 22–32)

## 2021-05-25 LAB — DIFFERENTIAL
Abs Immature Granulocytes: 0.02 10*3/uL (ref 0.00–0.07)
Basophils Absolute: 0 10*3/uL (ref 0.0–0.1)
Basophils Relative: 1 %
Eosinophils Absolute: 0.1 10*3/uL (ref 0.0–0.5)
Eosinophils Relative: 1 %
Immature Granulocytes: 0 %
Lymphocytes Relative: 25 %
Lymphs Abs: 2.1 10*3/uL (ref 0.7–4.0)
Monocytes Absolute: 0.6 10*3/uL (ref 0.1–1.0)
Monocytes Relative: 7 %
Neutro Abs: 5.7 10*3/uL (ref 1.7–7.7)
Neutrophils Relative %: 66 %

## 2021-05-25 LAB — COMPREHENSIVE METABOLIC PANEL
ALT: 21 U/L (ref 0–44)
AST: 29 U/L (ref 15–41)
Albumin: 3.6 g/dL (ref 3.5–5.0)
Alkaline Phosphatase: 75 U/L (ref 38–126)
Anion gap: 10 (ref 5–15)
BUN: 11 mg/dL (ref 6–20)
CO2: 19 mmol/L — ABNORMAL LOW (ref 22–32)
Calcium: 8.4 mg/dL — ABNORMAL LOW (ref 8.9–10.3)
Chloride: 110 mmol/L (ref 98–111)
Creatinine, Ser: 1.01 mg/dL (ref 0.61–1.24)
GFR, Estimated: >60 mL/min (ref >60)
Glucose, Bld: 88 mg/dL (ref 70–99)
Potassium: 3.7 mmol/L (ref 3.5–5.1)
Sodium: 139 mmol/L (ref 135–145)
Total Bilirubin: 0.3 mg/dL (ref 0.3–1.2)
Total Protein: 6.3 g/dL — ABNORMAL LOW (ref 6.5–8.1)

## 2021-05-25 LAB — PROTIME-INR
INR: 0.9 (ref 0.8–1.2)
Prothrombin Time: 12.2 seconds (ref 11.4–15.2)

## 2021-05-25 LAB — CBC
HCT: 37 % — ABNORMAL LOW (ref 39.0–52.0)
Hemoglobin: 12.1 g/dL — ABNORMAL LOW (ref 13.0–17.0)
MCH: 29.3 pg (ref 26.0–34.0)
MCHC: 32.7 g/dL (ref 30.0–36.0)
MCV: 89.6 fL (ref 80.0–100.0)
Platelets: 313 10*3/uL (ref 150–400)
RBC: 4.13 MIL/uL — ABNORMAL LOW (ref 4.22–5.81)
RDW: 13.9 % (ref 11.5–15.5)
WBC: 8.5 10*3/uL (ref 4.0–10.5)
nRBC: 0 % (ref 0.0–0.2)

## 2021-05-25 LAB — RESP PANEL BY RT-PCR (FLU A&B, COVID) ARPGX2
Influenza A by PCR: NEGATIVE
Influenza B by PCR: NEGATIVE
SARS Coronavirus 2 by RT PCR: NEGATIVE

## 2021-05-25 LAB — ECHOCARDIOGRAM COMPLETE BUBBLE STUDY
Area-P 1/2: 3.06 cm2
S' Lateral: 2.9 cm

## 2021-05-25 LAB — APTT: aPTT: 23 seconds — ABNORMAL LOW (ref 24–36)

## 2021-05-25 LAB — HIV ANTIBODY (ROUTINE TESTING W REFLEX): HIV Screen 4th Generation wRfx: NONREACTIVE

## 2021-05-25 MED ORDER — ACETAMINOPHEN 160 MG/5ML PO SOLN: 650.0000 mg | ORAL | Status: DC | PRN

## 2021-05-25 MED ORDER — ACETAMINOPHEN 325 MG PO TABS: 650.0000 mg | ORAL_TABLET | ORAL | Status: DC | PRN

## 2021-05-25 MED ORDER — ACETAMINOPHEN 650 MG RE SUPP: 650.0000 mg | RECTAL | Status: DC | PRN

## 2021-05-25 MED ORDER — STROKE: EARLY STAGES OF RECOVERY BOOK
Freq: Once | Status: DC
  Filled 2021-05-25: qty 1

## 2021-05-25 MED ORDER — CLOPIDOGREL BISULFATE 300 MG PO TABS
300.0000 mg | ORAL_TABLET | Freq: Once | ORAL | Status: AC
  Administered 2021-05-25: 14:00:00 300 mg via ORAL
  Filled 2021-05-25: qty 1

## 2021-05-25 MED ORDER — CLOPIDOGREL BISULFATE 75 MG PO TABS
75.0000 mg | ORAL_TABLET | Freq: Every day | ORAL | Status: DC
  Administered 2021-05-26 – 2021-05-27 (×2): 75 mg via ORAL
  Filled 2021-05-25 (×2): qty 1

## 2021-05-25 MED ORDER — SENNOSIDES-DOCUSATE SODIUM 8.6-50 MG PO TABS: 1.0000 | ORAL_TABLET | Freq: Every evening | ORAL | Status: DC | PRN

## 2021-05-25 MED ORDER — ASPIRIN EC 81 MG PO TBEC
81.0000 mg | DELAYED_RELEASE_TABLET | Freq: Every day | ORAL | Status: DC
  Administered 2021-05-25 – 2021-05-27 (×3): 81 mg via ORAL
  Filled 2021-05-25 (×3): qty 1

## 2021-05-25 NOTE — ED Provider Notes (Signed)
[facility] EMERGENCY DEPARTMENT Provider Note   CSN: [id number] Arrival date & time: 05/25/21  0930     History Chief Complaint  Patient presents with   stroke sx    Drew Waller is a 60 y.o. male.  HPI Patient presents for strokelike symptoms.  This occurred shortly after waking up this morning.  He got up to use the bathroom.  When he got up, he noticed some weakness in his left leg.  When he was in the bathroom, he noticed a left-sided facial droop as well as left hemibody numbness and weakness.  His girlfriend, who was there as well, noticed the same thing.  EMS was called.  Patient estimates that symptoms lasted for 15 minutes.  Currently, he denies any symptoms other than a mild generalized headache.  He has no known history of CVA.  He does not have a seizure history.  Prior to the onset of symptoms this morning, he has been in her normal state of health.  He does drink daily (estimated 3-4 shots of liquor).  Last alcoholic drink was last night.  He does not take any blood thinning or antiplatelet medications.    History reviewed. No pertinent past medical history.  Patient Active Problem List   Diagnosis Date Noted   Stroke [facility]) 05/25/2021   Abnormal EKG    Chest pain 05/20/2017   Leukocytosis 05/20/2017   Hypokalemia 05/20/2017   Renal insufficiency 05/20/2017   AKI (acute kidney injury) ([place])    Dental abscess    Pulmonary nodules     Past Surgical History:  Procedure Laterality Date   TUMOR EXCISION         Family History  Problem Relation Age of Onset   Heart attack Mother        at age 72   Hypertension Mother    Diabetes Mother    Prostate cancer Father    Hypertension Father    Healthy Sister    Healthy Brother    Heart attack Maternal Grandmother        at age 62   Hypertension Maternal Grandmother     Social History   Tobacco Use   Smoking status: Never   Smokeless tobacco: Never  Vaping Use   Vaping Use: Never  used  Substance Use Topics   Alcohol use: Yes    Alcohol/week: 7.0 standard drinks    Types: 7 Cans of beer per week   Drug use: No    Home Medications Prior to Admission medications   Not on File    Allergies    Patient has no known allergies.  Review of Systems   Review of Systems  Constitutional:  Negative for activity change, appetite change, chills, fatigue and fever.  HENT:  Negative for congestion, ear pain and sore throat.   Eyes:  Negative for pain and visual disturbance.  Respiratory:  Negative for cough, chest tightness, shortness of breath and wheezing.   Cardiovascular:  Negative for chest pain and palpitations.  Gastrointestinal:  Negative for abdominal pain, constipation, diarrhea, nausea and vomiting.  Genitourinary:  Negative for dysuria, flank pain and hematuria.  Musculoskeletal:  Negative for arthralgias, back pain, joint swelling, myalgias and neck pain.  Skin:  Negative for color change and rash.  Neurological:  Positive for facial asymmetry, weakness, numbness and headaches. Negative for seizures and syncope.  Hematological:  Does not bruise/bleed easily.  Psychiatric/Behavioral:  Negative for confusion and decreased concentration.   All other systems reviewed  and are negative.  Physical Exam Updated Vital Signs BP (!) 154/94    Pulse 90    Temp 98.3 F (36.8 C) (Oral)    Resp 14    Ht 5\' 6"  (1.676 m)    Wt 81.6 kg    SpO2 98%    BMI 29.05 kg/m   Physical Exam Vitals and nursing note reviewed.  Constitutional:      General: He is not in acute distress.    Appearance: Normal appearance. He is well-developed and normal weight. He is not ill-appearing, toxic-appearing or diaphoretic.  HENT:     Head: Normocephalic and atraumatic.     Right Ear: External ear normal.     Left Ear: External ear normal.     Nose: Nose normal.     Mouth/Throat:     Mouth: Mucous membranes are moist.     Pharynx: Oropharynx is clear.  Eyes:     General: No visual  field deficit or scleral icterus.    Extraocular Movements: Extraocular movements intact.     Comments: Bilateral conjunctival injection  Cardiovascular:     Rate and Rhythm: Normal rate and regular rhythm.     Heart sounds: No murmur heard. Pulmonary:     Effort: Pulmonary effort is normal. No respiratory distress.     Breath sounds: Normal breath sounds. No wheezing or rales.  Chest:     Chest wall: No tenderness.  Abdominal:     General: Abdomen is flat.     Palpations: Abdomen is soft.     Tenderness: There is no abdominal tenderness.  Musculoskeletal:        General: No swelling or tenderness. Normal range of motion.     Cervical back: Normal range of motion and neck supple. No rigidity.     Right lower leg: No edema.     Left lower leg: No edema.  Skin:    General: Skin is warm and dry.     Capillary Refill: Capillary refill takes less than 2 seconds.     Coloration: Skin is not jaundiced or pale.  Neurological:     Mental Status: He is alert and oriented to person, place, and time.     Cranial Nerves: Cranial nerves 2-12 are intact. No cranial nerve deficit, dysarthria or facial asymmetry.     Sensory: Sensation is intact. No sensory deficit.     Motor: Motor function is intact. No weakness, abnormal muscle tone or pronator drift.     Coordination: Finger-Nose-Finger Test and Heel to [employer] normal.  Psychiatric:        Mood and Affect: Mood normal.        Behavior: Behavior normal.        Thought Content: Thought content normal.        Judgment: Judgment normal.    ED Results / Procedures / Treatments   Labs (all labs ordered are listed, but only abnormal results are displayed) Labs Reviewed  APTT - Abnormal; Notable for the following components:      Result Value   aPTT 23 (*)    All other components within normal limits  CBC - Abnormal; Notable for the following components:   RBC 4.13 (*)    Hemoglobin 12.1 (*)    HCT 37.0 (*)    All other components  within normal limits  COMPREHENSIVE METABOLIC PANEL - Abnormal; Notable for the following components:   CO2 19 (*)    Calcium 8.4 (*)    Total  Protein 6.3 (*)    All other components within normal limits  I-STAT CHEM 8, ED - Abnormal; Notable for the following components:   Calcium, Ion 1.05 (*)    Hemoglobin 12.2 (*)    HCT 36.0 (*)    All other components within normal limits  RESP PANEL BY RT-PCR (FLU A&B, COVID) ARPGX2  PROTIME-INR  DIFFERENTIAL  HIV ANTIBODY (ROUTINE TESTING W REFLEX)  HEMOGLOBIN A1C  LIPID PANEL  CBG MONITORING, ED    EKG EKG Interpretation  Date/Time:  Wednesday May 25 2021 09:32:13 EST Ventricular Rate:  90 PR Interval:  151 QRS Duration: 93 QT Interval:  359 QTC Calculation: 440 R Axis:   5 Text Interpretation: Sinus rhythm Borderline T wave abnormalities Confirmed by Godfrey Pick [phone]) on 05/25/2021 9:54:38 AM  Radiology CT HEAD WO CONTRAST  Result Date: 05/25/2021 CLINICAL DATA:  60 year old male with left side weakness onset this morning. EXAM: CT HEAD WITHOUT CONTRAST TECHNIQUE: Contiguous axial images were obtained from the base of the skull through the vertex without intravenous contrast. COMPARISON:  CT neck 05/21/2017. FINDINGS: Brain: Cerebral volume is within normal limits for age. No midline shift, ventriculomegaly, mass effect, evidence of mass lesion, intracranial hemorrhage or evidence of cortically based acute infarction. Gray-white matter differentiation is within normal limits throughout the brain. No encephalomalacia identified. Vascular: No suspicious intracranial vascular hyperdensity. Skull: No acute osseous abnormality identified. Sinuses/Orbits: Moderate bilateral paranasal sinus mucosal thickening with some sinus bubbly opacity and fluid. Tympanic cavities and mastoids are clear. Other: Small volume fluid in the visible nasopharynx. No acute orbit or scalp soft tissue finding. IMPRESSION: 1. Normal for age non contrast CT  appearance of the brain. 2. Acute bilateral paranasal sinusitis. Electronically Signed   By: Genevie Ann M.D.   On: 05/25/2021 10:29   MR ANGIO HEAD WO CONTRAST  Result Date: 05/25/2021 CLINICAL DATA:  Transient ischemic attack. Left-sided weakness and slurred speech. EXAM: MRI HEAD WITHOUT CONTRAST MRA HEAD WITHOUT CONTRAST MRA NECK WITHOUT CONTRAST TECHNIQUE: Multiplanar, multiecho pulse sequences of the brain and surrounding structures were obtained without intravenous contrast. Angiographic images of the Circle of Willis were obtained using MRA technique without intravenous contrast. Angiographic images of the neck were obtained using MRA technique without intravenous contrast. Carotid stenosis measurements (when applicable) are obtained utilizing NASCET criteria, using the distal internal carotid diameter as the denominator. COMPARISON:  Head CT same day FINDINGS: MRI HEAD FINDINGS Brain: Diffusion imaging shows a small acute infarction in the superior cerebellum on the right. Punctate acute infarction in the left superolateral thalamus. No other acute finding. The brainstem and cerebellum are otherwise normal. Cerebral hemispheres show mild to moderate chronic small-vessel ischemic change of the white matter. No cortical or large vessel territory infarction. No mass lesion, hemorrhage, hydrocephalus or extra-axial collection. Vascular: Major vessels at the base of the brain show flow. Skull and upper cervical spine: Negative Sinuses/Orbits: Mucosal inflammatory changes of the paranasal sinuses. Orbits negative. Other: None MRA HEAD FINDINGS Both internal carotid arteries are widely patent into the brain. No siphon stenosis. The anterior and middle cerebral vessels are patent without proximal stenosis, aneurysm or vascular malformation. Both vertebral arteries are widely patent to the basilar. No basilar stenosis. Posterior circulation branch vessels appear normal. MRA NECK FINDINGS Both common carotid  arteries widely patent to the bifurcation. Both carotid bifurcations are normal. Both cervical internal carotid arteries are normal. Vertebral artery origins are not included on the examination. Beyond the origins, both vessels are widely patent through  the neck, through the foramen magnum to the basilar. The vertebral arteries are proximally equal in size. IMPRESSION: Small acute infarction in the superior cerebellum on the right. Punctate acute infarction in the left superolateral thalamus. 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 Mild to moderate chronic small-vessel ischemic changes elsewhere within the cerebral hemispheric white matter. Normal MR angiography of the neck and intracranial vessels. The vertebral artery origins are not included on the study, but there is no secondary sign of flow limiting stenosis affecting either vertebral artery. Electronically Signed   By: Nelson Chimes M.D.   On: 05/25/2021 13:09   MR MRA NECK WO CONTRAST  Result Date: 05/25/2021 CLINICAL DATA:  Transient ischemic attack. Left-sided weakness and slurred speech. EXAM: MRI HEAD WITHOUT CONTRAST MRA HEAD WITHOUT CONTRAST MRA NECK WITHOUT CONTRAST TECHNIQUE: Multiplanar, multiecho pulse sequences of the brain and surrounding structures were obtained without intravenous contrast. Angiographic images of the Circle of Willis were obtained using MRA technique without intravenous contrast. Angiographic images of the neck were obtained using MRA technique without intravenous contrast. Carotid stenosis measurements (when applicable) are obtained utilizing NASCET criteria, using the distal internal carotid diameter as the denominator. COMPARISON:  Head CT same day FINDINGS: MRI HEAD FINDINGS Brain: Diffusion imaging shows a small acute infarction in the superior cerebellum on the right. Punctate acute infarction in the left superolateral thalamus. No other acute finding. The brainstem and cerebellum are  otherwise normal. Cerebral hemispheres show mild to moderate chronic small-vessel ischemic change of the white matter. No cortical or large vessel territory infarction. No mass lesion, hemorrhage, hydrocephalus or extra-axial collection. Vascular: Major vessels at the base of the brain show flow. Skull and upper cervical spine: Negative Sinuses/Orbits: Mucosal inflammatory changes of the paranasal sinuses. Orbits negative. Other: None MRA HEAD FINDINGS Both internal carotid arteries are widely patent into the brain. No siphon stenosis. The anterior and middle cerebral vessels are patent without proximal stenosis, aneurysm or vascular malformation. Both vertebral arteries are widely patent to the basilar. No basilar stenosis. Posterior circulation branch vessels appear normal. MRA NECK FINDINGS Both common carotid arteries widely patent to the bifurcation. Both carotid bifurcations are normal. Both cervical internal carotid arteries are normal. Vertebral artery origins are not included on the examination. Beyond the origins, both vessels are widely patent through the neck, through the foramen magnum to the basilar. The vertebral arteries are proximally equal in size. IMPRESSION: Small acute infarction in the superior cerebellum on the right. Punctate acute infarction in the left superolateral thalamus. 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 Mild to moderate chronic small-vessel ischemic changes elsewhere within the cerebral hemispheric white matter. Normal MR angiography of the neck and intracranial vessels. The vertebral artery origins are not included on the study, but there is no secondary sign of flow limiting stenosis affecting either vertebral artery. Electronically Signed   By: Nelson Chimes M.D.   On: 05/25/2021 13:09   MR BRAIN WO CONTRAST  Result Date: 05/25/2021 CLINICAL DATA:  Transient ischemic attack. Left-sided weakness and slurred speech. EXAM: MRI HEAD WITHOUT  CONTRAST MRA HEAD WITHOUT CONTRAST MRA NECK WITHOUT CONTRAST TECHNIQUE: Multiplanar, multiecho pulse sequences of the brain and surrounding structures were obtained without intravenous contrast. Angiographic images of the Circle of Willis were obtained using MRA technique without intravenous contrast. Angiographic images of the neck were obtained using MRA technique without intravenous contrast. Carotid stenosis measurements (when applicable) are obtained utilizing NASCET criteria, using the distal internal carotid diameter as  the denominator. COMPARISON:  Head CT same day FINDINGS: MRI HEAD FINDINGS Brain: Diffusion imaging shows a small acute infarction in the superior cerebellum on the right. Punctate acute infarction in the left superolateral thalamus. No other acute finding. The brainstem and cerebellum are otherwise normal. Cerebral hemispheres show mild to moderate chronic small-vessel ischemic change of the white matter. No cortical or large vessel territory infarction. No mass lesion, hemorrhage, hydrocephalus or extra-axial collection. Vascular: Major vessels at the base of the brain show flow. Skull and upper cervical spine: Negative Sinuses/Orbits: Mucosal inflammatory changes of the paranasal sinuses. Orbits negative. Other: None MRA HEAD FINDINGS Both internal carotid arteries are widely patent into the brain. No siphon stenosis. The anterior and middle cerebral vessels are patent without proximal stenosis, aneurysm or vascular malformation. Both vertebral arteries are widely patent to the basilar. No basilar stenosis. Posterior circulation branch vessels appear normal. MRA NECK FINDINGS Both common carotid arteries widely patent to the bifurcation. Both carotid bifurcations are normal. Both cervical internal carotid arteries are normal. Vertebral artery origins are not included on the examination. Beyond the origins, both vessels are widely patent through the neck, through the foramen magnum to the  basilar. The vertebral arteries are proximally equal in size. IMPRESSION: Small acute infarction in the superior cerebellum on the right. Punctate acute infarction in the left superolateral thalamus. 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 Mild to moderate chronic small-vessel ischemic changes elsewhere within the cerebral hemispheric white matter. Normal MR angiography of the neck and intracranial vessels. The vertebral artery origins are not included on the study, but there is no secondary sign of flow limiting stenosis affecting either vertebral artery. Electronically Signed   By: Paulina Fusi M.D.   On: 05/25/2021 13:09   ECHOCARDIOGRAM COMPLETE BUBBLE STUDY  Result Date: 05/25/2021    ECHOCARDIOGRAM REPORT   Patient Name:   Drew Waller Date of Exam: 05/25/2021 Medical Rec #:  [id number]        Height:       66.0 in Accession #:    [id number]       Weight:       180.0 lb Date of Birth:  1960/10/12        BSA:          1.912 m Patient Age:    60 years         BP:           129/96 mmHg Patient Gender: M                HR:           83 bpm. Exam Location:  Inpatient Procedure: 2D Echo, Cardiac Doppler, Color Doppler and Saline Contrast Bubble            Study Indications:    TIA (transient ischemic attack) 435.9 / G45.9  History:        Patient has prior history of Echocardiogram examinations, most                 recent 05/22/2017.  Sonographer:    Eulah Pont RDCS Referring Phys: [id number] Gloris [place] IMPRESSIONS  1. Left ventricular ejection fraction, by estimation, is 60 to 65%. The left ventricle has normal function. The left ventricle has no regional wall motion abnormalities. Left ventricular diastolic parameters are consistent with Grade I diastolic dysfunction (impaired relaxation).  2. Right ventricular systolic function is normal. The right ventricular size is normal. Tricuspid regurgitation signal is inadequate  for assessing PA pressure.  3. The mitral valve is  normal in structure. No evidence of mitral valve regurgitation. No evidence of mitral stenosis.  4. The aortic valve is normal in structure. Aortic valve regurgitation is trivial. No aortic stenosis is present.  5. The inferior vena cava is normal in size with greater than 50% respiratory variability, suggesting right atrial pressure of 3 mmHg.  6. Agitated saline contrast bubble study was negative, with no evidence of any interatrial shunt. FINDINGS  Left Ventricle: Left ventricular ejection fraction, by estimation, is 60 to 65%. The left ventricle has normal function. The left ventricle has no regional wall motion abnormalities. The left ventricular internal cavity size was normal in size. There is  no left ventricular hypertrophy. Left ventricular diastolic parameters are consistent with Grade I diastolic dysfunction (impaired relaxation). Normal left ventricular filling pressure. Right Ventricle: The right ventricular size is normal. No increase in right ventricular wall thickness. Right ventricular systolic function is normal. Tricuspid regurgitation signal is inadequate for assessing PA pressure. Left Atrium: Left atrial size was normal in size. Right Atrium: Right atrial size was normal in size. Pericardium: There is no evidence of pericardial effusion. Mitral Valve: The mitral valve is normal in structure. No evidence of mitral valve regurgitation. No evidence of mitral valve stenosis. Tricuspid Valve: The tricuspid valve is normal in structure. Tricuspid valve regurgitation is trivial. No evidence of tricuspid stenosis. Aortic Valve: The aortic valve is normal in structure. Aortic valve regurgitation is trivial. No aortic stenosis is present. Pulmonic Valve: The pulmonic valve was normal in structure. Pulmonic valve regurgitation is not visualized. No evidence of pulmonic stenosis. Aorta: The aortic root is normal in size and structure. Venous: The inferior vena cava is normal in size with greater than 50%  respiratory variability, suggesting right atrial pressure of 3 mmHg. IAS/Shunts: No atrial level shunt detected by color flow Doppler. Agitated saline contrast was given intravenously to evaluate for intracardiac shunting. Agitated saline contrast bubble study was negative, with no evidence of any interatrial shunt.  LEFT VENTRICLE PLAX 2D LVIDd:         4.20 cm   Diastology LVIDs:         2.90 cm   LV e' medial:    6.05 cm/s LV PW:         1.20 cm   LV E/e' medial:  8.2 LV IVS:        1.10 cm   LV e' lateral:   8.24 cm/s LVOT diam:     2.10 cm   LV E/e' lateral: 6.0 LV SV:         55 LV SV Index:   29 LVOT Area:     3.46 cm  RIGHT VENTRICLE RV S prime:     14.50 cm/s TAPSE (M-mode): 2.3 cm LEFT ATRIUM             Index        RIGHT ATRIUM           Index LA diam:        3.40 cm 1.78 cm/m   RA Area:     11.70 cm LA Vol (A2C):   27.9 ml 14.59 ml/m  RA Volume:   22.90 ml  11.98 ml/m LA Vol (A4C):   24.8 ml 12.97 ml/m LA Biplane Vol: 28.0 ml 14.64 ml/m  AORTIC VALVE LVOT Vmax:   82.00 cm/s LVOT Vmean:  56.700 cm/s LVOT VTI:    0.159 m  AORTA  Ao Root diam: 3.20 cm Ao Asc diam:  3.50 cm MITRAL VALVE MV Area (PHT): 3.06 cm    SHUNTS MV Decel Time: 248 msec    Systemic VTI:  0.16 m MV E velocity: 49.50 cm/s  Systemic Diam: 2.10 cm MV A velocity: 67.90 cm/s MV E/A ratio:  0.73 Mihai Croitoru MD Electronically signed by Sanda Klein MD Signature Date/Time: 05/25/2021/4:02:16 PM    Final     Procedures Procedures   Medications Ordered in ED Medications  aspirin EC tablet 81 mg (81 mg Oral Given 05/25/21 1339)   stroke: mapping our early stages of recovery book (has no administration in time range)  acetaminophen (TYLENOL) tablet 650 mg (has no administration in time range)    Or  acetaminophen (TYLENOL) 160 MG/5ML solution 650 mg (has no administration in time range)    Or  acetaminophen (TYLENOL) suppository 650 mg (has no administration in time range)  senna-docusate (Senokot-S) tablet 1 tablet (has  no administration in time range)  clopidogrel (PLAVIX) tablet 75 mg (has no administration in time range)  clopidogrel (PLAVIX) tablet 300 mg (300 mg Oral Given 05/25/21 1339)    ED Course  I have reviewed the triage vital signs and the nursing notes.  Pertinent labs & imaging results that were available during my care of the patient were reviewed by me and considered in my medical decision making (see chart for details).    MDM Rules/Calculators/A&P                         Patient presents for transient strokelike symptoms.  Onset was this morning at 8 AM.  Symptoms were noticed by the patient and were also witnessed by his partner.  Symptoms included left hemibody weakness, numbness, and facial droop.  Estimated duration was 15 minutes.  Upon arrival in the ED, patient symptoms have resolved.  Vital signs are notable for moderate hypertension.  Patient currently complains of a very mild frontal headache.  He declines any analgesia.  On exam, he has no focal neurologic deficits.  Laboratory work-up was unremarkable.  Noncontrasted CT scan of head showed no ICH and no LVO.  There was an incidental finding of paranasal sinusitis.  Per chart review, patient does not see a primary care doctor.  He does likely have undiagnosed risk factors for stroke.  His story is concerning for TIA.  I discussed this patient with neurology who does recommend an admission for TIA work-up.  Patient had no recurrence of strokelike symptoms while in the ED.  MRI studies were ordered.  Patient was admitted to medicine for further management.  Final Clinical Impression(s) / ED Diagnoses Final diagnoses:  Stroke-like symptoms    Rx / DC Orders ED Discharge Orders          Ordered    Ambulatory referral to Neurology  Status:  Canceled       Comments: An appointment is requested in approximately: 1 week   05/25/21 1027             Godfrey Pick, MD 05/25/21 1811

## 2021-05-25 NOTE — Consult Note (Signed)
Neurology Consultation Reason for Consult: TIA Referring Physician: Doren Custard, R  CC: Left-sided weakness  History is obtained from: Patient  HPI: Drew Waller is a 60 y.o. male who was in his normal state of health this morning and then around 845 this morning noticed the entire left side went numb.  He states that it was also weak and he was slurring his speech.  It lasted for approximately 15 minutes and then completely resolved.   LKW: 8:45 AM tpa given?: no, resolution of symptoms Premorbid modified rankin scale: Zero NIHSS: 1   ROS: A 14 point ROS was performed and is negative except as noted in the HPI.   Past medical history: None, though he does not typically see doctors   Family History  Problem Relation Age of Onset   Heart attack Mother        at age 2   Hypertension Mother    Diabetes Mother    Prostate cancer Father    Hypertension Father    Healthy Sister    Healthy Brother    Heart attack Maternal Grandmother        at age 58   Hypertension Maternal Grandmother      Social History:  reports that he has never smoked. He has never used smokeless tobacco. He reports current alcohol use of about 7.0 standard drinks per week. He reports that he does not use drugs.   Exam: Current vital signs: BP (!) 138/91    Pulse 85    Temp 98.3 F (36.8 C) (Oral)    Resp 12    Ht 5\' 6"  (1.676 m)    Wt 81.6 kg    SpO2 99%    BMI 29.05 kg/m  Vital signs in last 24 hours: Temp:  [98.3 F (36.8 C)] 98.3 F (36.8 C) (12/21 0940) Pulse Rate:  [85-94] 85 (12/21 1130) Resp:  [10-18] 12 (12/21 1130) BP: (129-145)/(91-96) 138/91 (12/21 1130) SpO2:  [97 %-100 %] 99 % (12/21 1130) Weight:  [81.6 kg] 81.6 kg (12/21 0940)   Physical Exam  Constitutional: Appears well-developed and well-nourished.  Psych: Affect appropriate to situation Eyes: No scleral injection HENT: No OP obstruction MSK: no joint deformities.  Cardiovascular: Normal rate and regular rhythm.   Respiratory: Effort normal, non-labored breathing GI: Soft.  No distension. There is no tenderness.  Skin: WDI  Neuro: Mental Status: Patient is awake, alert, oriented to person, place, month, year, and situation. Patient is able to give a clear and coherent history. No signs of aphasia or neglect He does have mild dysarthria Cranial Nerves: II: Visual Fields are full. Pupils are equal, round, and reactive to light.   III,IV, VI: EOMI without ptosis or diploplia.  V: Facial sensation is symmetric to temperature VII: Facial movement is symmetric.  VIII: hearing is intact to voice X: Uvula elevates symmetrically XI: Shoulder shrug is symmetric. XII: tongue is midline without atrophy or fasciculations.  Motor: Tone is normal. Bulk is normal. 5/5 strength was present in all four extremities.  Sensory: Sensation is symmetric to light touch and temperature in the arms and legs. Cerebellar: No ataxia on finger-nose-finger or heel shin    I have reviewed labs in epic and the results pertinent to this consultation are: Creatinine 1.01  I have reviewed the images obtained: CT head-negative  Impression: 60 year old male with transient left-sided weakness and numbness.  Given that I suspect he still has a mild dysarthria(though he denies it), I wonder if he has had a  small ischemic stroke.  Certainly his symptoms are too mild for tPA at this time.  I do think he would benefit from being admitted for secondary stroke factor risk reduction.  Recommendations: - HgbA1c, fasting lipid panel - MRI of the brain without contrast, MRA head and neck - Frequent neuro checks - Echocardiogram - Prophylactic therapy-Antiplatelet med: Aspirin - dose 81mg  and plavix 75mg  daily after 300mg  load  - Risk factor modification - Telemetry monitoring - PT consult, OT consult, Speech consult - Stroke team to follow    , MD [facility] [phone]  If 7pm- 7am, please  page neurology on call as listed in AMION.

## 2021-05-25 NOTE — Progress Notes (Signed)
°  Echocardiogram 2D Echocardiogram has been performed.  Drew Waller 05/25/2021, 3:36 PM

## 2021-05-25 NOTE — ED Notes (Signed)
Patient transported to CT 

## 2021-05-25 NOTE — ED Notes (Signed)
Patient transported to vascular. 

## 2021-05-25 NOTE — ED Notes (Signed)
Patient transported to MRI 

## 2021-05-25 NOTE — ED Triage Notes (Signed)
Pt arrived via [facility] from home for c/o left sided weakness, slurred speech, left facial droop and HA upon waking at 0800 today. Pt's LKW was at midnight before bed. Per EMS the left sided facial droop, slurred speech and left sided weakness at 0850 upon their arrival. They were the second EMS to be dispatched. Pt is A&Ox4.

## 2021-05-25 NOTE — H&P (Addendum)
Date: 05/25/2021     Patient Name:  Drew Waller MRN: [id number]  DOB: Mar 26, 1961 Age / Sex: 60 y.o., male   PCP: Pcp, No         Medical Service: Internal Medicine Teaching Service       Attending Physician: Dr. Earl Lagos, MD    Intern (1st Contact): Dr. Vicente Serene Pager: [phone]  Resident (2nd Contact): Elige Radon, MD Pager: [facility] [phone]       After Hours (After 5p/  First Contact Pager: [phone]  weekends / holidays): Second Contact Pager: [phone]   SUBJECTIVE:  Chief Complaint: Left-sided weakness  History of Present Illness: Drew Waller is a  60 y.o. male with a no known PMH who presents to [facility] with left-sided weakness.  Drew Waller was in his usual state of health until he woke up this morning at 8 AM.  He states that he noticed weakness in his left lower extremity, and left upper extremity. He states that he tried to get up to go to the bathroom, but did not realize that he was incontinent of feces until standing additionally noted that he was dizzy. He was able to ambulate to his bathroom but he felt like he was "stumbling."  When he reached the bathroom he was unable to hold his toothbrush and let it fall to the floor, and also noticed that he was slurring his words and that his face appeared "slack."  He was unable to ambulate out of the bathroom and needed assistance from his girlfriend who is at bedside.  She additionally endorses that he was slurring his speech and had difficulty ambulating.  He does additionally endorse some numbness in the arm and leg during this time.  Given his symptoms EMS was called and by the time they arrived the numbness in his arm and leg were alleviating and he was back to speaking at baseline.  He states that the entire incident lasted approximately 15 to 20 minutes.  His last known baseline was the evening prior.  Evidently this is never occurred to him in the past.  He denies additional headaches, nausea, vomiting, chest pain,  dyspnea, abdominal pain, constipation, or diarrhea.  In the ED, the patient was evaluated under code stroke, CT scan initially showed no acute process, initial labs show hemoglobin of 12.1, and IMTS was called for admission for TIA.  Medications: No current facility-administered medications on file prior to encounter.   No current outpatient medications on file prior to encounter.    Past Medical History: History reviewed. No pertinent past medical history.  Social:  Lives - [address] [place],  [place] [address], lives at home with 21 year old daughter and girlfriend. Occupation -works as a Investment banker, operational in retirement community PCP - Pcp, No,  Substance use: Illicit -none ETOH -10-12 alcoholic beverages per week Tobacco -none  Family History: Family History  Problem Relation Age of Onset   Heart attack Mother        at age 67   Hypertension Mother    Diabetes Mother    Prostate cancer Father    Hypertension Father    Healthy Sister    Healthy Brother    Heart attack Maternal Grandmother        at age 56   Hypertension Maternal Grandmother     Allergies: Allergies as of 05/25/2021   (No Known Allergies)    Review of Systems: A complete ROS was negative except as per HPI.   OBJECTIVE:  Physical Exam: Blood  pressure (!) 155/99, pulse 95, temperature 98.3 F (36.8 C), temperature source Oral, resp. rate 18, height 5\' 6"  (1.676 m), weight 81.6 kg, SpO2 100 %. Physical Exam Constitutional:      General: He is not in acute distress.    Appearance: Normal appearance. He is not ill-appearing, toxic-appearing or diaphoretic.     Comments: Comfortably in bed, no acute distress, answering full sentences  HENT:     Head: Normocephalic and atraumatic.  Eyes:     General: No scleral icterus.       Right eye: No discharge.        Left eye: No discharge.     Extraocular Movements: Extraocular movements intact.     Conjunctiva/sclera: Conjunctivae normal.     Pupils: Pupils  are equal, round, and reactive to light.  Cardiovascular:     Rate and Rhythm: Normal rate and regular rhythm.     Pulses: Normal pulses.     Heart sounds: Normal heart sounds. No murmur heard.   No friction rub. No gallop.  Pulmonary:     Effort: Pulmonary effort is normal.     Breath sounds: Normal breath sounds.  Abdominal:     General: Abdomen is flat. Bowel sounds are normal.     Palpations: Abdomen is soft.  Musculoskeletal:     Right lower leg: No edema.     Left lower leg: No edema.     Comments: Strength 5 out of 5 in the upper and lower extremities bilaterally  Neurological:     General: No focal deficit present.     Mental Status: He is alert and oriented to person, place, and time. Mental status is at baseline.     Cranial Nerves: No cranial nerve deficit.     Motor: No weakness.     Comments:  Nerves II through XII grossly intact bilaterally  Psychiatric:        Mood and Affect: Mood normal.        Behavior: Behavior normal.    Pertinent Labs: CBC    Component Value Date/Time   WBC 8.5 05/25/2021 0939   RBC 4.13 (L) 05/25/2021 0939   HGB 12.2 (L) 05/25/2021 1004   HCT 36.0 (L) 05/25/2021 1004   PLT 313 05/25/2021 0939   MCV 89.6 05/25/2021 0939   MCH 29.3 05/25/2021 0939   MCHC 32.7 05/25/2021 0939   RDW 13.9 05/25/2021 0939   LYMPHSABS 2.1 05/25/2021 0939   MONOABS 0.6 05/25/2021 0939   EOSABS 0.1 05/25/2021 0939   BASOSABS 0.0 05/25/2021 0939     CMP     Component Value Date/Time   NA 144 05/25/2021 1004   K 3.6 05/25/2021 1004   CL 110 05/25/2021 1004   CO2 19 (L) 05/25/2021 0939   GLUCOSE 83 05/25/2021 1004   BUN 11 05/25/2021 1004   CREATININE 1.00 05/25/2021 1004   CALCIUM 8.4 (L) 05/25/2021 0939   PROT 6.3 (L) 05/25/2021 0939   ALBUMIN 3.6 05/25/2021 0939   AST 29 05/25/2021 0939   ALT 21 05/25/2021 0939   ALKPHOS 75 05/25/2021 0939   BILITOT 0.3 05/25/2021 0939   GFRNONAA >60 05/25/2021 0939   GFRAA >60 05/26/2017 0527     Pertinent Imaging: CT HEAD WO CONTRAST  Result Date: 05/25/2021 CLINICAL DATA:  60 year old male with left side weakness onset this morning. EXAM: CT HEAD WITHOUT CONTRAST TECHNIQUE: Contiguous axial images were obtained from the base of the skull through the vertex without intravenous contrast. COMPARISON:  CT neck 05/21/2017. FINDINGS: Brain: Cerebral volume is within normal limits for age. No midline shift, ventriculomegaly, mass effect, evidence of mass lesion, intracranial hemorrhage or evidence of cortically based acute infarction. Gray-white matter differentiation is within normal limits throughout the brain. No encephalomalacia identified. Vascular: No suspicious intracranial vascular hyperdensity. Skull: No acute osseous abnormality identified. Sinuses/Orbits: Moderate bilateral paranasal sinus mucosal thickening with some sinus bubbly opacity and fluid. Tympanic cavities and mastoids are clear. Other: Small volume fluid in the visible nasopharynx. No acute orbit or scalp soft tissue finding. IMPRESSION: 1. Normal for age non contrast CT appearance of the brain. 2. Acute bilateral paranasal sinusitis. Electronically Signed   By: Odessa Fleming M.D.   On: 05/25/2021 10:29   MR ANGIO HEAD WO CONTRAST  Result Date: 05/25/2021 CLINICAL DATA:  Transient ischemic attack. Left-sided weakness and slurred speech. EXAM: MRI HEAD WITHOUT CONTRAST MRA HEAD WITHOUT CONTRAST MRA NECK WITHOUT CONTRAST TECHNIQUE: Multiplanar, multiecho pulse sequences of the brain and surrounding structures were obtained without intravenous contrast. Angiographic images of the Circle of Willis were obtained using MRA technique without intravenous contrast. Angiographic images of the neck were obtained using MRA technique without intravenous contrast. Carotid stenosis measurements (when applicable) are obtained utilizing NASCET criteria, using the distal internal carotid diameter as the denominator. COMPARISON:  Head CT same day  FINDINGS: MRI HEAD FINDINGS Brain: Diffusion imaging shows a small acute infarction in the superior cerebellum on the right. Punctate acute infarction in the left superolateral thalamus. No other acute finding. The brainstem and cerebellum are otherwise normal. Cerebral hemispheres show mild to moderate chronic small-vessel ischemic change of the white matter. No cortical or large vessel territory infarction. No mass lesion, hemorrhage, hydrocephalus or extra-axial collection. Vascular: Major vessels at the base of the brain show flow. Skull and upper cervical spine: Negative Sinuses/Orbits: Mucosal inflammatory changes of the paranasal sinuses. Orbits negative. Other: None MRA HEAD FINDINGS Both internal carotid arteries are widely patent into the brain. No siphon stenosis. The anterior and middle cerebral vessels are patent without proximal stenosis, aneurysm or vascular malformation. Both vertebral arteries are widely patent to the basilar. No basilar stenosis. Posterior circulation branch vessels appear normal. MRA NECK FINDINGS Both common carotid arteries widely patent to the bifurcation. Both carotid bifurcations are normal. Both cervical internal carotid arteries are normal. Vertebral artery origins are not included on the examination. Beyond the origins, both vessels are widely patent through the neck, through the foramen magnum to the basilar. The vertebral arteries are proximally equal in size. IMPRESSION: Small acute infarction in the superior cerebellum on the right. Punctate acute infarction in the left superolateral thalamus. 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 Mild to moderate chronic small-vessel ischemic changes elsewhere within the cerebral hemispheric white matter. Normal MR angiography of the neck and intracranial vessels. The vertebral artery origins are not included on the study, but there is no secondary sign of flow limiting stenosis affecting either  vertebral artery. Electronically Signed   By: Paulina Fusi M.D.   On: 05/25/2021 13:09   MR MRA NECK WO CONTRAST  Result Date: 05/25/2021 CLINICAL DATA:  Transient ischemic attack. Left-sided weakness and slurred speech. EXAM: MRI HEAD WITHOUT CONTRAST MRA HEAD WITHOUT CONTRAST MRA NECK WITHOUT CONTRAST TECHNIQUE: Multiplanar, multiecho pulse sequences of the brain and surrounding structures were obtained without intravenous contrast. Angiographic images of the Circle of Willis were obtained using MRA technique without intravenous contrast. Angiographic images of the neck were obtained using MRA technique without  intravenous contrast. Carotid stenosis measurements (when applicable) are obtained utilizing NASCET criteria, using the distal internal carotid diameter as the denominator. COMPARISON:  Head CT same day FINDINGS: MRI HEAD FINDINGS Brain: Diffusion imaging shows a small acute infarction in the superior cerebellum on the right. Punctate acute infarction in the left superolateral thalamus. No other acute finding. The brainstem and cerebellum are otherwise normal. Cerebral hemispheres show mild to moderate chronic small-vessel ischemic change of the white matter. No cortical or large vessel territory infarction. No mass lesion, hemorrhage, hydrocephalus or extra-axial collection. Vascular: Major vessels at the base of the brain show flow. Skull and upper cervical spine: Negative Sinuses/Orbits: Mucosal inflammatory changes of the paranasal sinuses. Orbits negative. Other: None MRA HEAD FINDINGS Both internal carotid arteries are widely patent into the brain. No siphon stenosis. The anterior and middle cerebral vessels are patent without proximal stenosis, aneurysm or vascular malformation. Both vertebral arteries are widely patent to the basilar. No basilar stenosis. Posterior circulation branch vessels appear normal. MRA NECK FINDINGS Both common carotid arteries widely patent to the bifurcation. Both  carotid bifurcations are normal. Both cervical internal carotid arteries are normal. Vertebral artery origins are not included on the examination. Beyond the origins, both vessels are widely patent through the neck, through the foramen magnum to the basilar. The vertebral arteries are proximally equal in size. IMPRESSION: Small acute infarction in the superior cerebellum on the right. Punctate acute infarction in the left superolateral thalamus. 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 Mild to moderate chronic small-vessel ischemic changes elsewhere within the cerebral hemispheric white matter. Normal MR angiography of the neck and intracranial vessels. The vertebral artery origins are not included on the study, but there is no secondary sign of flow limiting stenosis affecting either vertebral artery. Electronically Signed   By: Paulina Fusi M.D.   On: 05/25/2021 13:09   MR BRAIN WO CONTRAST  Result Date: 05/25/2021 CLINICAL DATA:  Transient ischemic attack. Left-sided weakness and slurred speech. EXAM: MRI HEAD WITHOUT CONTRAST MRA HEAD WITHOUT CONTRAST MRA NECK WITHOUT CONTRAST TECHNIQUE: Multiplanar, multiecho pulse sequences of the brain and surrounding structures were obtained without intravenous contrast. Angiographic images of the Circle of Willis were obtained using MRA technique without intravenous contrast. Angiographic images of the neck were obtained using MRA technique without intravenous contrast. Carotid stenosis measurements (when applicable) are obtained utilizing NASCET criteria, using the distal internal carotid diameter as the denominator. COMPARISON:  Head CT same day FINDINGS: MRI HEAD FINDINGS Brain: Diffusion imaging shows a small acute infarction in the superior cerebellum on the right. Punctate acute infarction in the left superolateral thalamus. No other acute finding. The brainstem and cerebellum are otherwise normal. Cerebral hemispheres show mild to  moderate chronic small-vessel ischemic change of the white matter. No cortical or large vessel territory infarction. No mass lesion, hemorrhage, hydrocephalus or extra-axial collection. Vascular: Major vessels at the base of the brain show flow. Skull and upper cervical spine: Negative Sinuses/Orbits: Mucosal inflammatory changes of the paranasal sinuses. Orbits negative. Other: None MRA HEAD FINDINGS Both internal carotid arteries are widely patent into the brain. No siphon stenosis. The anterior and middle cerebral vessels are patent without proximal stenosis, aneurysm or vascular malformation. Both vertebral arteries are widely patent to the basilar. No basilar stenosis. Posterior circulation branch vessels appear normal. MRA NECK FINDINGS Both common carotid arteries widely patent to the bifurcation. Both carotid bifurcations are normal. Both cervical internal carotid arteries are normal. Vertebral artery origins are not included  on the examination. Beyond the origins, both vessels are widely patent through the neck, through the foramen magnum to the basilar. The vertebral arteries are proximally equal in size. IMPRESSION: Small acute infarction in the superior cerebellum on the right. Punctate acute infarction in the left superolateral thalamus. 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 Mild to moderate chronic small-vessel ischemic changes elsewhere within the cerebral hemispheric white matter. Normal MR angiography of the neck and intracranial vessels. The vertebral artery origins are not included on the study, but there is no secondary sign of flow limiting stenosis affecting either vertebral artery. Electronically Signed   By: Paulina Fusi M.D.   On: 05/25/2021 13:09    EKG: normal EKG, normal sinus rhythm without ischemic changes  ASSESSMENT & PLAN:  Patient Summary: Drew Waller is a 60 y.o. male with a pertinent PMH who presented with sided weakness and admit for  right superior cerebellum and left superolateral thalamus stroke.   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  Last known baseline was the prior evening.  Physical exam unremarkable on presentation.  Cranial nerves intact bilaterally MRI brain shows small acute infarction in the superior cerebellum on the right with a punctate acute infarction of the left suprolateral thalamus.  Patient is unaware of any underlying conditions that he may have.  He does not follow with PCP in the outpatient setting.   -- Allow for permissive HTN in the setting of acute stroke (systolic < 220 and diastolic < 120) - Appreciate stroke team's recommendations -- ASA 81 mg daily  -- Plavix 300 mg -- Echocardiogram  -- A1C  -- Lipid panel  -- Tele monitoring  -- SLP eval -- PT/OT -- Frequent neuro checks  #History of pulmonary nodules with cavitary lung lesions Admitted in 05/26/2017 for pulmonary nodules and cavitary lung lesions.  HIV was nonreactive, PSA, AFP and CEA were all negative.  Sputum AFB cultures were negative x3 and the patient was started on Zosyn and Zithromax.  And switch to oral Augmentin total of a 6-week course.  She was meant to follow-up with pulmonary in the outpatient setting for repeat CT scan, unfortunately he was lost to follow-up. - Consider further imaging in the outpatient setting and to follow-up with pulmonary.    Best Practice: Diet: N.p.o. VTE: SCD's Start: 05/25/21 1353 Code: Full Code Dispo: Observation with expected length of stay less than 2 midnights. Anticipated Discharge Location:  Pending further work-up Barriers to Discharge: Continued medical work-up   Signature: Dolan Amen, MD Internal Medicine Resident, PGY-3 [facility]  Pager: [phone] 2:00 PM,  05/25/2021   Please contact the on call pager after 5 pm and on weekends at [phone].

## 2021-05-26 DIAGNOSIS — I6300 Cerebral infarction due to thrombosis of unspecified precerebral artery: Secondary | ICD-10-CM

## 2021-05-26 LAB — HEMOGLOBIN A1C
Hgb A1c MFr Bld: 5.5 % (ref 4.8–5.6)
Mean Plasma Glucose: 111.15 mg/dL

## 2021-05-26 LAB — RAPID URINE DRUG SCREEN, HOSP PERFORMED
Amphetamines: NOT DETECTED
Barbiturates: NOT DETECTED
Benzodiazepines: NOT DETECTED
Cocaine: POSITIVE — AB
Opiates: NOT DETECTED
Tetrahydrocannabinol: POSITIVE — AB

## 2021-05-26 LAB — ANTITHROMBIN III: AntiThromb III Func: 99 % (ref 75–120)

## 2021-05-26 LAB — LIPID PANEL
Cholesterol: 173 mg/dL (ref 0–200)
HDL: 58 mg/dL (ref >40)
LDL Cholesterol: 99 mg/dL (ref 0–99)
Total CHOL/HDL Ratio: 3 RATIO
Triglycerides: 80 mg/dL (ref <150)
VLDL: 16 mg/dL (ref 0–40)

## 2021-05-26 MED ORDER — ROSUVASTATIN CALCIUM 20 MG PO TABS
20.0000 mg | ORAL_TABLET | Freq: Every day | ORAL | Status: DC
  Administered 2021-05-26 – 2021-05-27 (×2): 20 mg via ORAL
  Filled 2021-05-26 (×2): qty 1

## 2021-05-26 NOTE — Progress Notes (Signed)
° °  HD#1 SUBJECTIVE:  Patient Summary: Drew Waller is a  60 y.o. male with a no known PMH who presents to [facility] with left-sided weakness. Initial CTH was negative but MRI-brain showed multiple infarcts, concerning for an embolic source.   Overnight Events: NAEON  Interim History: Patient reports doing well. He reports complete resolution of his symptoms and has no complaints.   OBJECTIVE:  Vital Signs: Vitals:   05/26/21 0334 05/26/21 0827 05/26/21 1150 05/26/21 1618  BP: (!) 161/108 (!) 153/98 (!) 163/96 (!) 148/96  Pulse: 79 71 72 68  Resp: 15 15 14 16   Temp: 98.6 F (37 C) (!) 97.3 F (36.3 C) 98.7 F (37.1 C) 98.1 F (36.7 C)  TempSrc: Oral Oral Oral Oral  SpO2: 100% 98% 100% 100%  Weight:      Height:         No intake or output data in the 24 hours ending 05/26/21 1629 Net IO Since Admission: -750 mL [05/26/21 1629]  Physical Exam: Physical Exam Vitals reviewed.  Cardiovascular:     Rate and Rhythm: Normal rate and regular rhythm.     Pulses: Normal pulses.     Heart sounds: No murmur heard. Pulmonary:     Effort: Pulmonary effort is normal.     Breath sounds: Normal breath sounds.  Abdominal:     General: Bowel sounds are normal.     Palpations: Abdomen is soft.     Tenderness: There is no abdominal tenderness.  Neurological:     Mental Status: He is alert.     Comments: 5/5 strength in UE and LE Sensation intact in UE and LE No dysmetria on finger to nose EOM intact CN intact    Patient Lines/Drains/Airways Status     Active Line/Drains/Airways     Name Placement date Placement time Site Days   Peripheral IV 05/25/21 18 G Left Antecubital 05/25/21  --  Antecubital  1             ASSESSMENT/PLAN:  Assessment: Drew Waller is a  60 y.o. male with a no known PMH who presents to [facility] with left-sided weakness. Initial CTH was negative but MRI-brain showed multiple infarcts, concerning for an embolic source.    Plan: #Acute  stroke Patient with no PMHx presented with a 20 min episode of L sided weakness and facial droop, now resolved, was found to have multiple infarcts on MRI-brain (R superior cerebellar and L superolateral thalamic with findings suggestive of microembolic disease). Given his young age, Neuro has elected to obtain hypercoagulable and vasculitic labs, as well as TEE, TCD, and DVT study. Lipids with LDL of 99, normal A1C. Echo without structural or valvular disease and no interatrial shunt.  -DAPT for 3 wks, followed by ASA alone and risk factor modification -F/u imaging studies: TCD, DVT, TEE -F/u labs  FEN/GI: NPO at [facility] VTE: SCDs Dispo: home Code Status: FULL  Signature: [facility], MD PGY-1 Pager: [phone]  Please contact the on call pager after 5 pm and on weekends at [phone].

## 2021-05-26 NOTE — H&P (View-Only) (Signed)
STROKE TEAM PROGRESS NOTE   INTERVAL HISTORY His girlfriend and daughter are at the bedside.  Patient explains that he was washing his face yesterday when his left hand and arm went numb and his left leg was not working properly.  He states he was stumbling around.  He was able to move his left hand and arm.  He also noticed that his mouth felt like it was full of peanut butter when he was having difficulty speaking.  After the symptoms started he also noted a headache.  He states this is never happened before.  We discussed proceeding with a TEE tomorrow given that he does have 2 different strokes.  The patient also mentions that he was hospitalized in 2018 for respiratory issues.  Hypercoagulability panel ordered.  Vitals:   05/25/21 1840 05/25/21 2006 05/25/21 2333 05/26/21 0334  BP: (!) 144/93 (!) 158/101 (!) 149/90 (!) 161/108  Pulse: 94 95 85 79  Resp: 16 18 17 15   Temp: 98.6 F (37 C) 98 F (36.7 C) 97.8 F (36.6 C) 98.6 F (37 C)  TempSrc: Oral Oral Oral Oral  SpO2: 100% 100% 100% 100%  Weight:      Height:       CBC:  Recent Labs  Lab 05/25/21 0939 05/25/21 1004  WBC 8.5  --   NEUTROABS 5.7  --   HGB 12.1* 12.2*  HCT 37.0* 36.0*  MCV 89.6  --   PLT 313  --    Basic Metabolic Panel:  Recent Labs  Lab 05/25/21 0939 05/25/21 1004  NA 139 144  K 3.7 3.6  CL 110 110  CO2 19*  --   GLUCOSE 88 83  BUN 11 11  CREATININE 1.01 1.00  CALCIUM 8.4*  --    Lipid Panel:  Recent Labs  Lab 05/26/21 0151  CHOL 173  TRIG 80  HDL 58  CHOLHDL 3.0  VLDL 16  LDLCALC 99   HgbA1c:  Recent Labs  Lab 05/26/21 0151  HGBA1C 5.5   Urine Drug Screen: No results for input(s): LABOPIA, COCAINSCRNUR, LABBENZ, AMPHETMU, THCU, LABBARB in the last 168 hours.  Alcohol Level No results for input(s): ETH in the last 168 hours.  IMAGING past 24 hours CT HEAD WO CONTRAST  Result Date: 05/25/2021 CLINICAL DATA:  60 year old male with left side weakness onset this morning. EXAM: CT  HEAD WITHOUT CONTRAST TECHNIQUE: Contiguous axial images were obtained from the base of the skull through the vertex without intravenous contrast. COMPARISON:  CT neck 05/21/2017. FINDINGS: Brain: Cerebral volume is within normal limits for age. No midline shift, ventriculomegaly, mass effect, evidence of mass lesion, intracranial hemorrhage or evidence of cortically based acute infarction. Gray-white matter differentiation is within normal limits throughout the brain. No encephalomalacia identified. Vascular: No suspicious intracranial vascular hyperdensity. Skull: No acute osseous abnormality identified. Sinuses/Orbits: Moderate bilateral paranasal sinus mucosal thickening with some sinus bubbly opacity and fluid. Tympanic cavities and mastoids are clear. Other: Small volume fluid in the visible nasopharynx. No acute orbit or scalp soft tissue finding. IMPRESSION: 1. Normal for age non contrast CT appearance of the brain. 2. Acute bilateral paranasal sinusitis. Electronically Signed   By: 05/23/2017 M.D.   On: 05/25/2021 10:29   MR ANGIO HEAD WO CONTRAST  Result Date: 05/25/2021 CLINICAL DATA:  Transient ischemic attack. Left-sided weakness and slurred speech. EXAM: MRI HEAD WITHOUT CONTRAST MRA HEAD WITHOUT CONTRAST MRA NECK WITHOUT CONTRAST TECHNIQUE: Multiplanar, multiecho pulse sequences of the brain and surrounding structures were  obtained without intravenous contrast. Angiographic images of the Circle of Willis were obtained using MRA technique without intravenous contrast. Angiographic images of the neck were obtained using MRA technique without intravenous contrast. Carotid stenosis measurements (when applicable) are obtained utilizing NASCET criteria, using the distal internal carotid diameter as the denominator. COMPARISON:  Head CT same day FINDINGS: MRI HEAD FINDINGS Brain: Diffusion imaging shows a small acute infarction in the superior cerebellum on the right. Punctate acute infarction in the left  superolateral thalamus. No other acute finding. The brainstem and cerebellum are otherwise normal. Cerebral hemispheres show mild to moderate chronic small-vessel ischemic change of the white matter. No cortical or large vessel territory infarction. No mass lesion, hemorrhage, hydrocephalus or extra-axial collection. Vascular: Major vessels at the base of the brain show flow. Skull and upper cervical spine: Negative Sinuses/Orbits: Mucosal inflammatory changes of the paranasal sinuses. Orbits negative. Other: None MRA HEAD FINDINGS Both internal carotid arteries are widely patent into the brain. No siphon stenosis. The anterior and middle cerebral vessels are patent without proximal stenosis, aneurysm or vascular malformation. Both vertebral arteries are widely patent to the basilar. No basilar stenosis. Posterior circulation branch vessels appear normal. MRA NECK FINDINGS Both common carotid arteries widely patent to the bifurcation. Both carotid bifurcations are normal. Both cervical internal carotid arteries are normal. Vertebral artery origins are not included on the examination. Beyond the origins, both vessels are widely patent through the neck, through the foramen magnum to the basilar. The vertebral arteries are proximally equal in size. IMPRESSION: Small acute infarction in the superior cerebellum on the right. Punctate acute infarction in the left superolateral thalamus. 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 Mild to moderate chronic small-vessel ischemic changes elsewhere within the cerebral hemispheric white matter. Normal MR angiography of the neck and intracranial vessels. The vertebral artery origins are not included on the study, but there is no secondary sign of flow limiting stenosis affecting either vertebral artery. Electronically Signed   By: Paulina Fusi M.D.   On: 05/25/2021 13:09   MR MRA NECK WO CONTRAST  Result Date: 05/25/2021 CLINICAL DATA:  Transient  ischemic attack. Left-sided weakness and slurred speech. EXAM: MRI HEAD WITHOUT CONTRAST MRA HEAD WITHOUT CONTRAST MRA NECK WITHOUT CONTRAST TECHNIQUE: Multiplanar, multiecho pulse sequences of the brain and surrounding structures were obtained without intravenous contrast. Angiographic images of the Circle of Willis were obtained using MRA technique without intravenous contrast. Angiographic images of the neck were obtained using MRA technique without intravenous contrast. Carotid stenosis measurements (when applicable) are obtained utilizing NASCET criteria, using the distal internal carotid diameter as the denominator. COMPARISON:  Head CT same day FINDINGS: MRI HEAD FINDINGS Brain: Diffusion imaging shows a small acute infarction in the superior cerebellum on the right. Punctate acute infarction in the left superolateral thalamus. No other acute finding. The brainstem and cerebellum are otherwise normal. Cerebral hemispheres show mild to moderate chronic small-vessel ischemic change of the white matter. No cortical or large vessel territory infarction. No mass lesion, hemorrhage, hydrocephalus or extra-axial collection. Vascular: Major vessels at the base of the brain show flow. Skull and upper cervical spine: Negative Sinuses/Orbits: Mucosal inflammatory changes of the paranasal sinuses. Orbits negative. Other: None MRA HEAD FINDINGS Both internal carotid arteries are widely patent into the brain. No siphon stenosis. The anterior and middle cerebral vessels are patent without proximal stenosis, aneurysm or vascular malformation. Both vertebral arteries are widely patent to the basilar. No basilar stenosis. Posterior circulation branch vessels appear  normal. MRA NECK FINDINGS Both common carotid arteries widely patent to the bifurcation. Both carotid bifurcations are normal. Both cervical internal carotid arteries are normal. Vertebral artery origins are not included on the examination. Beyond the origins, both  vessels are widely patent through the neck, through the foramen magnum to the basilar. The vertebral arteries are proximally equal in size. IMPRESSION: Small acute infarction in the superior cerebellum on the right. Punctate acute infarction in the left superolateral thalamus. 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 Mild to moderate chronic small-vessel ischemic changes elsewhere within the cerebral hemispheric white matter. Normal MR angiography of the neck and intracranial vessels. The vertebral artery origins are not included on the study, but there is no secondary sign of flow limiting stenosis affecting either vertebral artery. Electronically Signed   By: Paulina Fusi M.D.   On: 05/25/2021 13:09   MR BRAIN WO CONTRAST  Result Date: 05/25/2021 CLINICAL DATA:  Transient ischemic attack. Left-sided weakness and slurred speech. EXAM: MRI HEAD WITHOUT CONTRAST MRA HEAD WITHOUT CONTRAST MRA NECK WITHOUT CONTRAST TECHNIQUE: Multiplanar, multiecho pulse sequences of the brain and surrounding structures were obtained without intravenous contrast. Angiographic images of the Circle of Willis were obtained using MRA technique without intravenous contrast. Angiographic images of the neck were obtained using MRA technique without intravenous contrast. Carotid stenosis measurements (when applicable) are obtained utilizing NASCET criteria, using the distal internal carotid diameter as the denominator. COMPARISON:  Head CT same day FINDINGS: MRI HEAD FINDINGS Brain: Diffusion imaging shows a small acute infarction in the superior cerebellum on the right. Punctate acute infarction in the left superolateral thalamus. No other acute finding. The brainstem and cerebellum are otherwise normal. Cerebral hemispheres show mild to moderate chronic small-vessel ischemic change of the white matter. No cortical or large vessel territory infarction. No mass lesion, hemorrhage, hydrocephalus or extra-axial  collection. Vascular: Major vessels at the base of the brain show flow. Skull and upper cervical spine: Negative Sinuses/Orbits: Mucosal inflammatory changes of the paranasal sinuses. Orbits negative. Other: None MRA HEAD FINDINGS Both internal carotid arteries are widely patent into the brain. No siphon stenosis. The anterior and middle cerebral vessels are patent without proximal stenosis, aneurysm or vascular malformation. Both vertebral arteries are widely patent to the basilar. No basilar stenosis. Posterior circulation branch vessels appear normal. MRA NECK FINDINGS Both common carotid arteries widely patent to the bifurcation. Both carotid bifurcations are normal. Both cervical internal carotid arteries are normal. Vertebral artery origins are not included on the examination. Beyond the origins, both vessels are widely patent through the neck, through the foramen magnum to the basilar. The vertebral arteries are proximally equal in size. IMPRESSION: Small acute infarction in the superior cerebellum on the right. Punctate acute infarction in the left superolateral thalamus. 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 Mild to moderate chronic small-vessel ischemic changes elsewhere within the cerebral hemispheric white matter. Normal MR angiography of the neck and intracranial vessels. The vertebral artery origins are not included on the study, but there is no secondary sign of flow limiting stenosis affecting either vertebral artery. Electronically Signed   By: Paulina Fusi M.D.   On: 05/25/2021 13:09   ECHOCARDIOGRAM COMPLETE BUBBLE STUDY  Result Date: 05/25/2021    ECHOCARDIOGRAM REPORT   Patient Name:   JENS Warmack Date of Exam: 05/25/2021 Medical Rec #:  [id number]        Height:       66.0 in Accession #:  [id number]       Weight:       180.0 lb Date of Birth:  23-Nov-1960        BSA:          1.912 m Patient Age:    60 years         BP:           129/96 mmHg Patient  Gender: M                HR:           83 bpm. Exam Location:  Inpatient Procedure: 2D Echo, Cardiac Doppler, Color Doppler and Saline Contrast Bubble            Study Indications:    TIA (transient ischemic attack) 435.9 / G45.9  History:        Patient has prior history of Echocardiogram examinations, most                 recent 05/22/2017.  Sonographer:    Eulah Pont RDCS Referring Phys: [id number] Gloris [place] IMPRESSIONS  1. Left ventricular ejection fraction, by estimation, is 60 to 65%. The left ventricle has normal function. The left ventricle has no regional wall motion abnormalities. Left ventricular diastolic parameters are consistent with Grade I diastolic dysfunction (impaired relaxation).  2. Right ventricular systolic function is normal. The right ventricular size is normal. Tricuspid regurgitation signal is inadequate for assessing PA pressure.  3. The mitral valve is normal in structure. No evidence of mitral valve regurgitation. No evidence of mitral stenosis.  4. The aortic valve is normal in structure. Aortic valve regurgitation is trivial. No aortic stenosis is present.  5. The inferior vena cava is normal in size with greater than 50% respiratory variability, suggesting right atrial pressure of 3 mmHg.  6. Agitated saline contrast bubble study was negative, with no evidence of any interatrial shunt. FINDINGS  Left Ventricle: Left ventricular ejection fraction, by estimation, is 60 to 65%. The left ventricle has normal function. The left ventricle has no regional wall motion abnormalities. The left ventricular internal cavity size was normal in size. There is  no left ventricular hypertrophy. Left ventricular diastolic parameters are consistent with Grade I diastolic dysfunction (impaired relaxation). Normal left ventricular filling pressure. Right Ventricle: The right ventricular size is normal. No increase in right ventricular wall thickness. Right ventricular systolic function is normal.  Tricuspid regurgitation signal is inadequate for assessing PA pressure. Left Atrium: Left atrial size was normal in size. Right Atrium: Right atrial size was normal in size. Pericardium: There is no evidence of pericardial effusion. Mitral Valve: The mitral valve is normal in structure. No evidence of mitral valve regurgitation. No evidence of mitral valve stenosis. Tricuspid Valve: The tricuspid valve is normal in structure. Tricuspid valve regurgitation is trivial. No evidence of tricuspid stenosis. Aortic Valve: The aortic valve is normal in structure. Aortic valve regurgitation is trivial. No aortic stenosis is present. Pulmonic Valve: The pulmonic valve was normal in structure. Pulmonic valve regurgitation is not visualized. No evidence of pulmonic stenosis. Aorta: The aortic root is normal in size and structure. Venous: The inferior vena cava is normal in size with greater than 50% respiratory variability, suggesting right atrial pressure of 3 mmHg. IAS/Shunts: No atrial level shunt detected by color flow Doppler. Agitated saline contrast was given intravenously to evaluate for intracardiac shunting. Agitated saline contrast bubble study was negative, with no evidence of any interatrial shunt.  LEFT VENTRICLE PLAX 2D  LVIDd:         4.20 cm   Diastology LVIDs:         2.90 cm   LV e' medial:    6.05 cm/s LV PW:         1.20 cm   LV E/e' medial:  8.2 LV IVS:        1.10 cm   LV e' lateral:   8.24 cm/s LVOT diam:     2.10 cm   LV E/e' lateral: 6.0 LV SV:         55 LV SV Index:   29 LVOT Area:     3.46 cm  RIGHT VENTRICLE RV S prime:     14.50 cm/s TAPSE (M-mode): 2.3 cm LEFT ATRIUM             Index        RIGHT ATRIUM           Index LA diam:        3.40 cm 1.78 cm/m   RA Area:     11.70 cm LA Vol (A2C):   27.9 ml 14.59 ml/m  RA Volume:   22.90 ml  11.98 ml/m LA Vol (A4C):   24.8 ml 12.97 ml/m LA Biplane Vol: 28.0 ml 14.64 ml/m  AORTIC VALVE LVOT Vmax:   82.00 cm/s LVOT Vmean:  56.700 cm/s LVOT VTI:     0.159 m  AORTA Ao Root diam: 3.20 cm Ao Asc diam:  3.50 cm MITRAL VALVE MV Area (PHT): 3.06 cm    SHUNTS MV Decel Time: 248 msec    Systemic VTI:  0.16 m MV E velocity: 49.50 cm/s  Systemic Diam: 2.10 cm MV A velocity: 67.90 cm/s MV E/A ratio:  0.73 Mihai Croitoru MD Electronically signed by Thurmon Fair MD Signature Date/Time: 05/25/2021/4:02:16 PM    Final     PHYSICAL EXAM Pleasant middle-aged  male not in distress. . Afebrile. Head is nontraumatic. Neck is supple without bruit.    Cardiac exam no murmur or gallop. Lungs are clear to auscultation. Distal pulses are well felt.  Neurological Exam ;  Awake  Alert oriented x 3. Normal speech and language.eye movements full without nystagmus.fundi were not visualized. Vision acuity and fields appear normal. Hearing is normal. Palatal movements are normal. Face symmetric. Tongue midline. Normal strength, tone, reflexes and coordination. Normal sensation. Gait deferred.  ASSESSMENT/PLAN Mr. Gavon Majano is a 60 y.o. male with history of unknown respiratory issue presenting with left-side numbness and difficulty walking that lasted approximately 15-20 minutes.  Imaging of his brain shows a small acute infarction in the superior cerebellum on the right and a punctate acute infarction in the left superolateral thalamus.  He is scheduled to get a TEE tomorrow and consider loop recorder at discharge.  Hypercoagulability panel is pending as well as a urine drug screen.  Physical therapy recommends no follow-up.  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  MRI small acute infarction in the superior cerebellum on the right.  Punctate acute infarction in the left superolateral thalamus.  Suggestive of microembolic disease in the posterior circulation MRA  Head-mild to moderate chronic small vessel ischemic changes elsewhere within the cerebral  hemispheric white matter MRA Neck-normal Emmah R angiography imaging 2D Echo EF 60 to 65%.  Normal left ventricular function no regional wall abnormalities.  No atrial shunt detected TCD bubble study-scheduled 05/26/2021 Venous duplex-pending TEE-scheduled  for 05/27/2021 LDL 99 HgbA1c 5.5 VTE prophylaxis - SCDs    Diet   Diet regular Room service appropriate? Yes; Fluid consistency: Thin   No antithrombotic prior to admission, now on aspirin 81 mg daily and clopidogrel 75 mg daily.  For 3 weeks followed by aspirin alone Therapy recommendations: No PT follow-up recommended Disposition: Pending  Hypertension Home meds: None Stable Permissive hypertension (OK if < 220/120) but gradually normalize in 5-7 days Long-term BP goal normotensive  Hyperlipidemia Home meds: None LDL 99, goal < 70 Add Crestor 20 mg High intensity statin not indicated  Continue statin at discharge  Other Stroke Risk Factors Advanced Age >/= 48  Cigarette smoker, advised to stop smoking Substance abuse - UDS: pending Obesity, Body mass index is 29.05 kg/m., BMI >/= 30 associated with increased stroke risk, recommend weight loss, diet and exercise as appropriate    Hospital day # 0  Patient seen and examined by NP/APP with MD. MD to update note as needed.   Elmer Picker, DNP, FNP-BC [facility] Pager: [phone]  STROKE MD NOTE :  I have personally obtained history,examined this patient, reviewed notes, independently viewed imaging studies, participated in medical decision making and plan of care.ROS completed by me personally and pertinent positives fully documented  I have made any additions or clarifications directly to the above note. Agree with note above.  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  Given his young age would recommend  the evaluation by checking hypercoagulable panel labs, vasculitic labs, TEE for cardiac source of embolism and TCD bubble study for PFO.  Check lower extremity venous Dopplers for DVT.  Aspirin and Plavix for 3 weeks followed by aspirin alone and aggressive risk factor modification.  Crestor for elevated lipids.  Long discussion patient and girlfriend answered questions.  Greater than 50% time during this 35-minute visit was spent in counseling and coordination of care about his embolic and lacunar strokes and discussion about evaluation treatment and answered questions.  Delia Heady, MD Medical Director [facility] Pager: [phone] 05/26/2021 4:23 PM   To contact Stroke Continuity provider, please refer to [email or web address]. After hours, contact General Neurology

## 2021-05-26 NOTE — TOC Initial Note (Signed)
Transition of Care [facility]) - Initial/Assessment Note    Patient Details  Name: Drew Waller MRN: [id number] Date of Birth: [birth year]  Transition of Care [facility]) CM/SW Contact:    Drew Balo, RN Phone Number: 05/26/2021, 1:59 PM  Clinical Narrative:                 Patient is from home with his 58 year old daughter. He denies issues with transportation. He was not taking any medications prior to admission.  Pt currently without insurance. He states his will start Jan 1st. CM offered the [facility] and pt was interested. Appt placed on AVS. Pt may also use [facility] pharmacy as needed.  No f/u per PT.  Cm has asked MD to do 2 months of medications at d/c to cover pt until he sees MD in February. TOC following.  Expected Discharge Plan: Home/Self Care Barriers to Discharge: Continued Medical Work up   Patient Goals and CMS Choice     Choice offered to / list presented to : Patient  Expected Discharge Plan and Services Expected Discharge Plan: Home/Self Care   Discharge Planning Services: CM Consult   Living arrangements for the past 2 months: Single Family Home                                      Prior Living Arrangements/Services Living arrangements for the past 2 months: Single Family Home Lives with:: Minor Children Patient language and need for interpreter reviewed:: Yes Do you feel safe going back to the place where you live?: Yes            Criminal Activity/Legal Involvement Pertinent to Current Situation/Hospitalization: No - Comment as needed  Activities of Daily Living      Permission Sought/Granted                  Emotional Assessment Appearance:: Appears stated age Attitude/Demeanor/Rapport: Engaged Affect (typically observed): Accepting Orientation: : Oriented to Self, Oriented to Place, Oriented to  Time, Oriented to Situation   Psych Involvement: No (comment)  Admission diagnosis:  Stroke [facility]) [I63.9] Stroke-like symptoms  [R29.90] Patient Active Problem List   Diagnosis Date Noted   Stroke [facility]) 05/25/2021   Abnormal EKG    Chest pain 05/20/2017   Leukocytosis 05/20/2017   Hypokalemia 05/20/2017   Renal insufficiency 05/20/2017   AKI (acute kidney injury) [facility])    Dental abscess    Pulmonary nodules    PCP:  Pcp, No Pharmacy:   [facility] [address] ([place]),  - [address] [place] ([place]) [place] [address] Phone: [phone] Fax: [phone]  [facility] [address] [place] [address] Phone: [phone] Fax: [phone]     Social Determinants of Health (SDOH) Interventions    Readmission Risk Interventions No flowsheet data found.

## 2021-05-26 NOTE — Evaluation (Signed)
Occupational Therapy Evaluation Patient Details Name: Drew Waller MRN: [id number] DOB: [birth year] Today's Date: 05/26/2021   History of Present Illness 60 y.o. male with a no known PMH who presents to [facility] 12/21 with left-sided weakness. 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   Clinical Impression   Pt admitted for concerns listed above. PTA Pt reported that he was  independent with all ADL's and IADL's, including working as a Investment banker, operational at [employer]. At this time, pt appears to be back at his baseline. No cognitive concerns, vision WNL, and strength/ROM WFL.  He has no further OT needs at this time and acute OT will sign off.      Recommendations for follow up therapy are one component of a multi-disciplinary discharge planning process, led by the attending physician.  Recommendations may be updated based on patient status, additional functional criteria and insurance authorization.   Follow Up Recommendations  No OT follow up    Assistance Recommended at Discharge None  Functional Status Assessment  Patient has had a recent decline in their functional status and demonstrates the ability to make significant improvements in function in a reasonable and predictable amount of time.  Equipment Recommendations  None recommended by OT    Recommendations for Other Services       Precautions / Restrictions Precautions Precautions: None Restrictions Weight Bearing Restrictions: No      Mobility Bed Mobility Overal bed mobility: Independent                  Transfers Overall transfer level: Independent Equipment used: None                      Balance Overall balance assessment: Independent                                         ADL either performed or assessed with clinical judgement   ADL Overall ADL's : Independent;At baseline                                        General ADL Comments: No concerns, independent     Vision Baseline Vision/History: 0 No visual deficits Ability to See in Adequate Light: 0 Adequate Patient Visual Report: No change from baseline Vision Assessment?: No apparent visual deficits     Perception     Praxis      Pertinent Vitals/Pain Pain Assessment: No/denies pain     Hand Dominance Right   Extremity/Trunk Assessment Upper Extremity Assessment Upper Extremity Assessment: Overall WFL for tasks assessed   Lower Extremity Assessment Lower Extremity Assessment: Overall WFL for tasks assessed   Cervical / Trunk Assessment Cervical / Trunk Assessment: Normal   Communication Communication Communication: No difficulties   Cognition Arousal/Alertness: Awake/alert Behavior During Therapy: WFL for tasks assessed/performed Overall Cognitive Status: Within Functional Limits for tasks assessed                                       General Comments  VSS on RA    Exercises     Shoulder Instructions      Home Living Family/patient  expects to be discharged to:: Private residence Living Arrangements: Children Available Help at Discharge: Family;Available PRN/intermittently Type of Home: House Home Access: Stairs to enter [employer] of Steps: 2 Entrance Stairs-Rails: None Home Layout: One level     Bathroom Shower/Tub: Chief Strategy Officer: Standard Bathroom Accessibility: Yes   Home Equipment: None          Prior Functioning/Environment Prior Level of Function : Independent/Modified Independent;Driving;Working/employed             Mobility Comments: Works as a Investment banker, operational at [employer], drives, walks with no RW ADLs Comments: Indep        OT Problem List: Decreased strength;Decreased activity tolerance      OT Treatment/Interventions:      OT Goals(Current goals can be found in the care plan section) Acute Rehab  OT Goals Patient Stated Goal: To get home today OT Goal Formulation: All assessment and education complete, DC therapy Time For Goal Achievement: 05/26/21 Potential to Achieve Goals: Good  OT Frequency:     Barriers to D/C:            Co-evaluation              AM-PAC OT "6 Clicks" Daily Activity     Outcome Measure Help from another person eating meals?: None Help from another person taking care of personal grooming?: None Help from another person toileting, which includes using toliet, bedpan, or urinal?: None Help from another person bathing (including washing, rinsing, drying)?: None Help from another person to put on and taking off regular upper body clothing?: None Help from another person to put on and taking off regular lower body clothing?: None 6 Click Score: 24   End of Session Nurse Communication: Mobility status  Activity Tolerance: Patient tolerated treatment well Patient left: in chair;with call bell/phone within reach;with family/visitor present  OT Visit Diagnosis: Unsteadiness on feet (R26.81);Other abnormalities of gait and mobility (R26.89);Muscle weakness (generalized) (M62.81)                Time: 3300-7622 OT Time Calculation (min): 16 min Charges:  OT General Charges $OT Visit: 1 Visit OT Evaluation $OT Eval Low Complexity: 1 Low  Ortha Metts H., OTR/L Acute Rehabilitation  Tanda Morrissey Elane Bing Plume 05/26/2021, 3:06 PM

## 2021-05-26 NOTE — Evaluation (Addendum)
Physical Therapy Evaluation Patient Details Name: Drew Waller MRN: [id number] DOB: 1960/10/19 Today's Date: 05/26/2021  History of Present Illness  60 y.o. male with a no known PMH who presents to [facility] 12/21 with left-sided weakness. 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   Clinical Impression  PT eval complete. Pt is independent with all functional mobility, including stairs. BLE strength is intact and symmetrical. PT scored 24/24 on DGI for balance. Pt reports headache and L side numbness have resolved. Pt educated on BEFAST for recognition of stroke signs. No further PT intervention indicated. PT signing off.     Recommendations for follow up therapy are one component of a multi-disciplinary discharge planning process, led by the attending physician.  Recommendations may be updated based on patient status, additional functional criteria and insurance authorization.  Follow Up Recommendations No PT follow up    Assistance Recommended at Discharge None  Functional Status Assessment Patient has not had a recent decline in their functional status  Equipment Recommendations  None recommended by PT    Recommendations for Other Services       Precautions / Restrictions Precautions Precautions: None      Mobility  Bed Mobility Overal bed mobility: Independent                  Transfers Overall transfer level: Independent Equipment used: None                    Ambulation/Gait Ambulation/Gait assistance: Independent Gait Distance (Feet): 500 Feet Assistive device: None Gait Pattern/deviations: WFL(Within Functional Limits)   Gait velocity interpretation: >4.37 ft/sec, indicative of normal walking speed      Stairs Stairs: Yes Stairs assistance: Modified independent (Device/Increase time) Stair Management: One rail Left;Alternating pattern Number of  Stairs: 12    Wheelchair Mobility    Modified Rankin (Stroke Patients Only) Modified Rankin (Stroke Patients Only) Pre-Morbid Rankin Score: No symptoms Modified Rankin: No symptoms     Balance Overall balance assessment: Independent                               Standardized Balance Assessment Standardized Balance Assessment : Dynamic Gait Index   Dynamic Gait Index Level Surface: Normal Change in Gait Speed: Normal Gait with Horizontal Head Turns: Normal Gait with Vertical Head Turns: Normal Gait and Pivot Turn: Normal Step Over Obstacle: Normal Step Around Obstacles: Normal Steps: Normal Total Score: 24       Pertinent Vitals/Pain Pain Assessment: No/denies pain    Home Living Family/patient expects to be discharged to:: Private residence Living Arrangements: Children (43 y.o. daughter) Available Help at Discharge: Family;Available PRN/intermittently Type of Home: House Home Access: Stairs to enter Entrance Stairs-Rails: None Entrance Stairs-Number of Steps: 2   Home Layout: One level Home Equipment: None      Prior Function Prior Level of Function : Independent/Modified Independent;Driving;Working/employed                     Hand Dominance        Extremity/Trunk Assessment   Upper Extremity Assessment Upper Extremity Assessment: Overall WFL for tasks assessed    Lower Extremity Assessment Lower Extremity Assessment: Overall WFL for tasks assessed    Cervical / Trunk Assessment Cervical / Trunk Assessment: Normal  Communication   Communication: No difficulties  Cognition Arousal/Alertness: Awake/alert Behavior During  Therapy: WFL for tasks assessed/performed Overall Cognitive Status: Within Functional Limits for tasks assessed                                          General Comments General comments (skin integrity, edema, etc.): Pt educated on BEFAST.    Exercises     Assessment/Plan    PT  Assessment Patient does not need any further PT services  PT Problem List         PT Treatment Interventions      PT Goals (Current goals can be found in the Care Plan section)  Acute Rehab PT Goals Patient Stated Goal: home PT Goal Formulation: All assessment and education complete, DC therapy    Frequency     Barriers to discharge        Co-evaluation               AM-PAC PT "6 Clicks" Mobility  Outcome Measure Help needed turning from your back to your side while in a flat bed without using bedrails?: None Help needed moving from lying on your back to sitting on the side of a flat bed without using bedrails?: None Help needed moving to and from a bed to a chair (including a wheelchair)?: None Help needed standing up from a chair using your arms (e.g., wheelchair or bedside chair)?: None Help needed to walk in hospital room?: None Help needed climbing 3-5 steps with a railing? : None 6 Click Score: 24    End of Session   Activity Tolerance: Patient tolerated treatment well Patient left: in bed;with call bell/phone within reach;with family/visitor present Nurse Communication: Mobility status PT Visit Diagnosis: Difficulty in walking, not elsewhere classified (R26.2)    Time: 6962-9528 PT Time Calculation (min) (ACUTE ONLY): 14 min   Charges:   PT Evaluation $PT Eval Low Complexity: 1 Low          Aida Raider, PT  Office # [phone] Pager [phone]   Ilda Foil 05/26/2021, 9:30 AM

## 2021-05-26 NOTE — Progress Notes (Signed)
STROKE TEAM PROGRESS NOTE  ° °INTERVAL HISTORY °His girlfriend and daughter are at the bedside.  Patient explains that he was washing his face yesterday when his left hand and arm went numb and his left leg was not working properly.  He states he was stumbling around.  He was able to move his left hand and arm.  He also noticed that his mouth felt like it was full of peanut butter when he was having difficulty speaking.  After the symptoms started he also noted a headache.  He states this is never happened before.  We discussed proceeding with a TEE tomorrow given that he does have 2 different strokes.  The patient also mentions that he was hospitalized in 2018 for respiratory issues.  Hypercoagulability panel ordered. ° °Vitals:  ° 05/25/21 1840 05/25/21 2006 05/25/21 2333 05/26/21 0334  °BP: (!) 144/93 (!) 158/101 (!) 149/90 (!) 161/108  °Pulse: 94 95 85 79  °Resp: 16 18 17 15  °Temp: 98.6 °F (37 °C) 98 °F (36.7 °C) 97.8 °F (36.6 °C) 98.6 °F (37 °C)  °TempSrc: Oral Oral Oral Oral  °SpO2: 100% 100% 100% 100%  °Weight:      °Height:      ° °CBC:  °Recent Labs  °Lab 05/25/21 °0939 05/25/21 °1004  °WBC 8.5  --   °NEUTROABS 5.7  --   °HGB 12.1* 12.2*  °HCT 37.0* 36.0*  °MCV 89.6  --   °PLT 313  --   ° °Basic Metabolic Panel:  °Recent Labs  °Lab 05/25/21 °0939 05/25/21 °1004  °NA 139 144  °K 3.7 3.6  °CL 110 110  °CO2 19*  --   °GLUCOSE 88 83  °BUN 11 11  °CREATININE 1.01 1.00  °CALCIUM 8.4*  --   ° °Lipid Panel:  °Recent Labs  °Lab 05/26/21 °0151  °CHOL 173  °TRIG 80  °HDL 58  °CHOLHDL 3.0  °VLDL 16  °LDLCALC 99  ° °HgbA1c:  °Recent Labs  °Lab 05/26/21 °0151  °HGBA1C 5.5  ° °Urine Drug Screen: No results for input(s): LABOPIA, COCAINSCRNUR, LABBENZ, AMPHETMU, THCU, LABBARB in the last 168 hours.  °Alcohol Level No results for input(s): ETH in the last 168 hours. ° °IMAGING past 24 hours °CT HEAD WO CONTRAST ° °Result Date: 05/25/2021 °CLINICAL DATA:  60-year-old male with left side weakness onset this morning. EXAM: CT  HEAD WITHOUT CONTRAST TECHNIQUE: Contiguous axial images were obtained from the base of the skull through the vertex without intravenous contrast. COMPARISON:  CT neck 05/21/2017. FINDINGS: Brain: Cerebral volume is within normal limits for age. No midline shift, ventriculomegaly, mass effect, evidence of mass lesion, intracranial hemorrhage or evidence of cortically based acute infarction. Gray-white matter differentiation is within normal limits throughout the brain. No encephalomalacia identified. Vascular: No suspicious intracranial vascular hyperdensity. Skull: No acute osseous abnormality identified. Sinuses/Orbits: Moderate bilateral paranasal sinus mucosal thickening with some sinus bubbly opacity and fluid. Tympanic cavities and mastoids are clear. Other: Small volume fluid in the visible nasopharynx. No acute orbit or scalp soft tissue finding. IMPRESSION: 1. Normal for age non contrast CT appearance of the brain. 2. Acute bilateral paranasal sinusitis. Electronically Signed   By: H  Hall M.D.   On: 05/25/2021 10:29  ° °MR ANGIO HEAD WO CONTRAST ° °Result Date: 05/25/2021 °CLINICAL DATA:  Transient ischemic attack. Left-sided weakness and slurred speech. EXAM: MRI HEAD WITHOUT CONTRAST MRA HEAD WITHOUT CONTRAST MRA NECK WITHOUT CONTRAST TECHNIQUE: Multiplanar, multiecho pulse sequences of the brain and surrounding structures were   obtained without intravenous contrast. Angiographic images of the Circle of Willis were obtained using MRA technique without intravenous contrast. Angiographic images of the neck were obtained using MRA technique without intravenous contrast. Carotid stenosis measurements (when applicable) are obtained utilizing NASCET criteria, using the distal internal carotid diameter as the denominator. COMPARISON:  Head CT same day FINDINGS: MRI HEAD FINDINGS Brain: Diffusion imaging shows a small acute infarction in the superior cerebellum on the right. Punctate acute infarction in the left  superolateral thalamus. No other acute finding. The brainstem and cerebellum are otherwise normal. Cerebral hemispheres show mild to moderate chronic small-vessel ischemic change of the white matter. No cortical or large vessel territory infarction. No mass lesion, hemorrhage, hydrocephalus or extra-axial collection. Vascular: Major vessels at the base of the brain show flow. Skull and upper cervical spine: Negative Sinuses/Orbits: Mucosal inflammatory changes of the paranasal sinuses. Orbits negative. Other: None MRA HEAD FINDINGS Both internal carotid arteries are widely patent into the brain. No siphon stenosis. The anterior and middle cerebral vessels are patent without proximal stenosis, aneurysm or vascular malformation. Both vertebral arteries are widely patent to the basilar. No basilar stenosis. Posterior circulation branch vessels appear normal. MRA NECK FINDINGS Both common carotid arteries widely patent to the bifurcation. Both carotid bifurcations are normal. Both cervical internal carotid arteries are normal. Vertebral artery origins are not included on the examination. Beyond the origins, both vessels are widely patent through the neck, through the foramen magnum to the basilar. The vertebral arteries are proximally equal in size. IMPRESSION: Small acute infarction in the superior cerebellum on the right. Punctate acute infarction in the left superolateral thalamus. 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 Mild to moderate chronic small-vessel ischemic changes elsewhere within the cerebral hemispheric white matter. Normal MR angiography of the neck and intracranial vessels. The vertebral artery origins are not included on the study, but there is no secondary sign of flow limiting stenosis affecting either vertebral artery. Electronically Signed   By: Mark  Shogry M.D.   On: 05/25/2021 13:09  ° °MR MRA NECK WO CONTRAST ° °Result Date: 05/25/2021 °CLINICAL DATA:  Transient  ischemic attack. Left-sided weakness and slurred speech. EXAM: MRI HEAD WITHOUT CONTRAST MRA HEAD WITHOUT CONTRAST MRA NECK WITHOUT CONTRAST TECHNIQUE: Multiplanar, multiecho pulse sequences of the brain and surrounding structures were obtained without intravenous contrast. Angiographic images of the Circle of Willis were obtained using MRA technique without intravenous contrast. Angiographic images of the neck were obtained using MRA technique without intravenous contrast. Carotid stenosis measurements (when applicable) are obtained utilizing NASCET criteria, using the distal internal carotid diameter as the denominator. COMPARISON:  Head CT same day FINDINGS: MRI HEAD FINDINGS Brain: Diffusion imaging shows a small acute infarction in the superior cerebellum on the right. Punctate acute infarction in the left superolateral thalamus. No other acute finding. The brainstem and cerebellum are otherwise normal. Cerebral hemispheres show mild to moderate chronic small-vessel ischemic change of the white matter. No cortical or large vessel territory infarction. No mass lesion, hemorrhage, hydrocephalus or extra-axial collection. Vascular: Major vessels at the base of the brain show flow. Skull and upper cervical spine: Negative Sinuses/Orbits: Mucosal inflammatory changes of the paranasal sinuses. Orbits negative. Other: None MRA HEAD FINDINGS Both internal carotid arteries are widely patent into the brain. No siphon stenosis. The anterior and middle cerebral vessels are patent without proximal stenosis, aneurysm or vascular malformation. Both vertebral arteries are widely patent to the basilar. No basilar stenosis. Posterior circulation branch vessels appear   normal. MRA NECK FINDINGS Both common carotid arteries widely patent to the bifurcation. Both carotid bifurcations are normal. Both cervical internal carotid arteries are normal. Vertebral artery origins are not included on the examination. Beyond the origins, both  vessels are widely patent through the neck, through the foramen magnum to the basilar. The vertebral arteries are proximally equal in size. IMPRESSION: Small acute infarction in the superior cerebellum on the right. Punctate acute infarction in the left superolateral thalamus. 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 Mild to moderate chronic small-vessel ischemic changes elsewhere within the cerebral hemispheric white matter. Normal MR angiography of the neck and intracranial vessels. The vertebral artery origins are not included on the study, but there is no secondary sign of flow limiting stenosis affecting either vertebral artery. Electronically Signed   By: Mark  Shogry M.D.   On: 05/25/2021 13:09  ° °MR BRAIN WO CONTRAST ° °Result Date: 05/25/2021 °CLINICAL DATA:  Transient ischemic attack. Left-sided weakness and slurred speech. EXAM: MRI HEAD WITHOUT CONTRAST MRA HEAD WITHOUT CONTRAST MRA NECK WITHOUT CONTRAST TECHNIQUE: Multiplanar, multiecho pulse sequences of the brain and surrounding structures were obtained without intravenous contrast. Angiographic images of the Circle of Willis were obtained using MRA technique without intravenous contrast. Angiographic images of the neck were obtained using MRA technique without intravenous contrast. Carotid stenosis measurements (when applicable) are obtained utilizing NASCET criteria, using the distal internal carotid diameter as the denominator. COMPARISON:  Head CT same day FINDINGS: MRI HEAD FINDINGS Brain: Diffusion imaging shows a small acute infarction in the superior cerebellum on the right. Punctate acute infarction in the left superolateral thalamus. No other acute finding. The brainstem and cerebellum are otherwise normal. Cerebral hemispheres show mild to moderate chronic small-vessel ischemic change of the white matter. No cortical or large vessel territory infarction. No mass lesion, hemorrhage, hydrocephalus or extra-axial  collection. Vascular: Major vessels at the base of the brain show flow. Skull and upper cervical spine: Negative Sinuses/Orbits: Mucosal inflammatory changes of the paranasal sinuses. Orbits negative. Other: None MRA HEAD FINDINGS Both internal carotid arteries are widely patent into the brain. No siphon stenosis. The anterior and middle cerebral vessels are patent without proximal stenosis, aneurysm or vascular malformation. Both vertebral arteries are widely patent to the basilar. No basilar stenosis. Posterior circulation branch vessels appear normal. MRA NECK FINDINGS Both common carotid arteries widely patent to the bifurcation. Both carotid bifurcations are normal. Both cervical internal carotid arteries are normal. Vertebral artery origins are not included on the examination. Beyond the origins, both vessels are widely patent through the neck, through the foramen magnum to the basilar. The vertebral arteries are proximally equal in size. IMPRESSION: Small acute infarction in the superior cerebellum on the right. Punctate acute infarction in the left superolateral thalamus. 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 Mild to moderate chronic small-vessel ischemic changes elsewhere within the cerebral hemispheric white matter. Normal MR angiography of the neck and intracranial vessels. The vertebral artery origins are not included on the study, but there is no secondary sign of flow limiting stenosis affecting either vertebral artery. Electronically Signed   By: Mark  Shogry M.D.   On: 05/25/2021 13:09  ° °ECHOCARDIOGRAM COMPLETE BUBBLE STUDY ° °Result Date: 05/25/2021 °   ECHOCARDIOGRAM REPORT   Patient Name:   Kwame Montalto Date of Exam: 05/25/2021 Medical Rec #:  [id number]        Height:       66.0 in Accession #:      [id number]       Weight:       180.0 lb Date of Birth:  10/01/1960        BSA:          1.912 m² Patient Age:    60 years         BP:           129/96 mmHg Patient  Gender: M                HR:           83 bpm. Exam Location:  Inpatient Procedure: 2D Echo, Cardiac Doppler, Color Doppler and Saline Contrast Bubble            Study Indications:    TIA (transient ischemic attack) 435.9 / G45.9  History:        Patient has prior history of Echocardiogram examinations, most                 recent 05/22/2017.  Sonographer:    Sarah Pirrotta RDCS Referring Phys: [id number] RYAN DIXON IMPRESSIONS  1. Left ventricular ejection fraction, by estimation, is 60 to 65%. The left ventricle has normal function. The left ventricle has no regional wall motion abnormalities. Left ventricular diastolic parameters are consistent with Grade I diastolic dysfunction (impaired relaxation).  2. Right ventricular systolic function is normal. The right ventricular size is normal. Tricuspid regurgitation signal is inadequate for assessing PA pressure.  3. The mitral valve is normal in structure. No evidence of mitral valve regurgitation. No evidence of mitral stenosis.  4. The aortic valve is normal in structure. Aortic valve regurgitation is trivial. No aortic stenosis is present.  5. The inferior vena cava is normal in size with greater than 50% respiratory variability, suggesting right atrial pressure of 3 mmHg.  6. Agitated saline contrast bubble study was negative, with no evidence of any interatrial shunt. FINDINGS  Left Ventricle: Left ventricular ejection fraction, by estimation, is 60 to 65%. The left ventricle has normal function. The left ventricle has no regional wall motion abnormalities. The left ventricular internal cavity size was normal in size. There is  no left ventricular hypertrophy. Left ventricular diastolic parameters are consistent with Grade I diastolic dysfunction (impaired relaxation). Normal left ventricular filling pressure. Right Ventricle: The right ventricular size is normal. No increase in right ventricular wall thickness. Right ventricular systolic function is normal.  Tricuspid regurgitation signal is inadequate for assessing PA pressure. Left Atrium: Left atrial size was normal in size. Right Atrium: Right atrial size was normal in size. Pericardium: There is no evidence of pericardial effusion. Mitral Valve: The mitral valve is normal in structure. No evidence of mitral valve regurgitation. No evidence of mitral valve stenosis. Tricuspid Valve: The tricuspid valve is normal in structure. Tricuspid valve regurgitation is trivial. No evidence of tricuspid stenosis. Aortic Valve: The aortic valve is normal in structure. Aortic valve regurgitation is trivial. No aortic stenosis is present. Pulmonic Valve: The pulmonic valve was normal in structure. Pulmonic valve regurgitation is not visualized. No evidence of pulmonic stenosis. Aorta: The aortic root is normal in size and structure. Venous: The inferior vena cava is normal in size with greater than 50% respiratory variability, suggesting right atrial pressure of 3 mmHg. IAS/Shunts: No atrial level shunt detected by color flow Doppler. Agitated saline contrast was given intravenously to evaluate for intracardiac shunting. Agitated saline contrast bubble study was negative, with no evidence of any interatrial shunt.  LEFT VENTRICLE PLAX 2D   LVIDd:         4.20 cm   Diastology LVIDs:         2.90 cm   LV e' medial:    6.05 cm/s LV PW:         1.20 cm   LV E/e' medial:  8.2 LV IVS:        1.10 cm   LV e' lateral:   8.24 cm/s LVOT diam:     2.10 cm   LV E/e' lateral: 6.0 LV SV:         55 LV SV Index:   29 LVOT Area:     3.46 cm²  RIGHT VENTRICLE RV S prime:     14.50 cm/s TAPSE (M-mode): 2.3 cm LEFT ATRIUM             Index        RIGHT ATRIUM           Index LA diam:        3.40 cm 1.78 cm/m²   RA Area:     11.70 cm² LA Vol (A2C):   27.9 ml 14.59 ml/m²  RA Volume:   22.90 ml  11.98 ml/m² LA Vol (A4C):   24.8 ml 12.97 ml/m² LA Biplane Vol: 28.0 ml 14.64 ml/m²  AORTIC VALVE LVOT Vmax:   82.00 cm/s LVOT Vmean:  56.700 cm/s LVOT VTI:     0.159 m  AORTA Ao Root diam: 3.20 cm Ao Asc diam:  3.50 cm MITRAL VALVE MV Area (PHT): 3.06 cm²    SHUNTS MV Decel Time: 248 msec    Systemic VTI:  0.16 m MV E velocity: 49.50 cm/s  Systemic Diam: 2.10 cm MV A velocity: 67.90 cm/s MV E/A ratio:  0.73 Mihai Croitoru MD Electronically signed by Mihai Croitoru MD Signature Date/Time: 05/25/2021/4:02:16 PM    Final    ° °PHYSICAL EXAM °Pleasant middle-aged  male not in distress. . Afebrile. Head is nontraumatic. Neck is supple without bruit.    Cardiac exam no murmur or gallop. Lungs are clear to auscultation. Distal pulses are well felt.  °Neurological Exam ;  °Awake  Alert oriented x 3. Normal speech and language.eye movements full without nystagmus.fundi were not visualized. Vision acuity and fields appear normal. Hearing is normal. Palatal movements are normal. Face symmetric. Tongue midline. °Normal strength, tone, reflexes and coordination. Normal sensation. Gait deferred. ° °ASSESSMENT/PLAN °Mr. Drew Waller is a 60 y.o. male with history of unknown respiratory issue presenting with left-side numbness and difficulty walking that lasted approximately 15-20 minutes.  Imaging of his brain shows a small acute infarction in the superior cerebellum on the right and a punctate acute infarction in the left superolateral thalamus.  He is scheduled to get a TEE tomorrow and consider loop recorder at discharge.  Hypercoagulability panel is pending as well as a urine drug screen.  Physical therapy recommends no follow-up. 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  °MRI small acute infarction in the superior cerebellum on the right.  Punctate acute infarction in the left superolateral thalamus.  Suggestive of microembolic disease in the posterior circulation °MRA  Head-mild to moderate chronic small vessel ischemic changes elsewhere within the cerebral  hemispheric white matter °MRA Neck-normal Emmah R angiography imaging °2D Echo EF 60 to 65%.  Normal left ventricular function no regional wall abnormalities.  No atrial shunt detected °TCD bubble study-scheduled 05/26/2021 °Venous duplex-pending °TEE-scheduled   for 05/27/2021 °LDL 99 °HgbA1c 5.5 °VTE prophylaxis - SCDs °   °Diet  ° Diet regular Room service appropriate? Yes; Fluid consistency: Thin  ° °No antithrombotic prior to admission, now on aspirin 81 mg daily and clopidogrel 75 mg daily.  For 3 weeks followed by aspirin alone °Therapy recommendations: No PT follow-up recommended °Disposition: Pending ° °Hypertension °Home meds: None °Stable °Permissive hypertension (OK if < 220/120) but gradually normalize in 5-7 days °Long-term BP goal normotensive ° °Hyperlipidemia °Home meds: None °LDL 99, goal < 70 °Add Crestor 20 mg °High intensity statin not indicated  °Continue statin at discharge ° °Other Stroke Risk Factors °Advanced Age >/= 65  °Cigarette smoker, advised to stop smoking °Substance abuse - UDS: pending °Obesity, Body mass index is 29.05 kg/m²., BMI >/= 30 associated with increased stroke risk, recommend weight loss, diet and exercise as appropriate  ° ° °Hospital day # 0 ° °Patient seen and examined by NP/APP with MD. MD to update note as needed.  ° °Devon Shafer, DNP, FNP-BC °[facility] °Pager: [phone] ° °STROKE MD NOTE :  °I have personally obtained history,examined this patient, reviewed notes, independently viewed imaging studies, participated in medical decision making and plan of care.ROS completed by me personally and pertinent positives fully documented  I have made any additions or clarifications directly to the above note. Agree with note above.  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  Given his young age would recommend  the evaluation by checking hypercoagulable panel labs, vasculitic labs, TEE for cardiac source of embolism and TCD bubble study for PFO.  Check lower extremity venous Dopplers for DVT.  Aspirin and Plavix for 3 weeks followed by aspirin alone and aggressive risk factor modification.  Crestor for elevated lipids.  Long discussion patient and girlfriend answered questions.  Greater than 50% time during this 35-minute visit was spent in counseling and coordination of care about his embolic and lacunar strokes and discussion about evaluation treatment and answered questions. ° °Lorne Winkels, MD °Medical Director °[facility] °Pager: [phone] °05/26/2021 4:23 PM ° ° °To contact Stroke Continuity provider, please refer to [email or web address]. °After hours, contact General Neurology ° °

## 2021-05-26 NOTE — Progress Notes (Signed)
SLP Cancellation Note  Patient Details Name: Mandel Seiden MRN: [id number] DOB: [birth year]   Cancelled treatment:       Reason Eval/Treat Not Completed: SLP screened, no needs identified, will sign off; pt stated symptoms have resolved; discussed with SLP detailed information/insight re: dx; no needs identified.   Tressie Stalker, M.S., CCC-SLP 05/26/2021, 2:25 PM

## 2021-05-26 NOTE — Progress Notes (Signed)
° ° °  [facility] has been requested to perform a transesophageal echocardiogram on Drew Waller for CVA.  After careful review of history and examination, the risks and benefits of transesophageal echocardiogram have been explained including risks of esophageal damage, perforation (1:10,000 risk), bleeding, pharyngeal hematoma as well as other potential complications associated with conscious sedation including aspiration, arrhythmia, respiratory failure and death. Alternatives to treatment were discussed, questions were answered. Patient is willing to proceed. His daughter a Psychologist, sport and exercise in the [place] system was present and agreed as well.   Nada Boozer, NP  05/26/2021 2:51 PM

## 2021-05-26 NOTE — Progress Notes (Signed)
°  Date: 05/26/2021  Patient name: Drew Waller  Medical record number: [id number]  Date of birth: January 15, 1961   I have seen and evaluated Drew Waller and discussed their care with the Residency Team.  In brief, patient is a 60 year old male with no known past medical history presents the ED with left-sided weakness x1 episode.  Patient states that he was feeling well yesterday morning when he noted sudden onset of weakness in his left lower and upper extremities at approximately 8 AM.  He tried to get up of the bathroom and noted that he was lightheaded.  Was able to ambulate to the bathroom but was stumbling to get there.  He was unable to hold his toothbrush and he fell to the floor and he also noted facial droop and slurring of his words.  He was unable to ambulate out of the bathroom and needed assistance from his girlfriend.  Patient also noted tingling and numbness of his left arm and leg.  EMS was called and he came to the ED for further evaluation.  Patient symptoms lasted approximate 1520 minutes and then resolved.  No chest pain, shortness of breath, no palpitations, no syncope, no nausea or vomiting, no diarrhea, no abdominal pain, no fevers or chills, no headache no blurry vision.  Today, patient states that he feels well and that he is back at his baseline and has no new complaints.  PMHx, Fam Hx, and/or Soc Hx : As per resident admit note  Vitals:   05/26/21 0827 05/26/21 1150  BP: (!) 153/98 (!) 163/96  Pulse: 71 72  Resp: 15 14  Temp: (!) 97.3 F (36.3 C) 98.7 F (37.1 C)  SpO2: 98% 100%   General: Awake, alert, oriented x3, NAD CVS: Regular rate and rhythm, normal heart sounds Lungs: CTA bilaterally Abdomen: Soft, nontender, nondistended, normoactive bowel sounds, Extremities: No edema noted, nontender to palpation Extremities: HEENT: Normocephalic, atraumatic Skin: Warm and dry Neuro: Power 5 out of 5 bilateral upper and lower extremities, sensation intact upper  and lower extremities, tongue central, shoulder shrug intact, extraocular movements intact, finger-nose test within normal limits, facial sensation intact  Assessment and Plan: I have seen and evaluated the patient as outlined above. I agree with the formulated Assessment and Plan as detailed in the residents' note, with the following changes:   1.  Acute CVA: -Patient presented to ED with tingling and numbness as well as weakness in his left upper and lower extremity with associated facial droop and slurring of his words and was found to have an acute right superior cerebellar infarct and left superolateral thalamic infarct on imaging.  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 -Neuro follow-up and recommendations appreciated -We will continue with aspirin, Plavix as well as high intensity statin for now -PT/OT follow-up and recommendations appreciated.  No further PT/OT required per their recommendations -SLP identified no needs and signed off -A1c was within normal limits -Patient with elevated LDL of 99 -The etiology of the patient's CVA remains uncertain.  We will follow-up hypercoagulable work-up as well as transcranial Dopplers and lower extremity Dopplers -TEE to be done tomorrow to rule out vegetation/clot as an etiology for his stroke -2D echo results noted.  Patient with a normal EF and no evidence of inter-atrial shunt on bubble study -No further work-up at this time -I suspect patient should be stable for DC home tomorrow if his work-up remains negative  Earl Lagos, MD 12/22/20224:13 PM

## 2021-05-26 NOTE — TOC CAGE-AID Note (Signed)
Transition of Care [facility]) - CAGE-AID Screening   Patient Details  Name: Drew Waller MRN: [id number] Date of Birth: February 20, 1961  Transition of Care [facility]) CM/SW Contact:    Clarie Camey C Tarpley-Carter, LCSWA Phone Number: 05/26/2021, 3:06 PM   Clinical Narrative: Pt participated in Cage-Aid.  Pt stated he does not use substance, but drinks ETOH.  Pt was offered resources, due to ETOH use.   CSW will provide pt with resources.  Kamisha Ell Tarpley-Carter, MSW, LCSW-A Pronouns:  She/Her/Hers  Transitions of Care Clinical Social Worker Direct Number:  [phone] [email or web address]  CAGE-AID Screening:    Have You Ever Felt You Ought to Cut Down on Your Drinking or Drug Use?: No Have People Annoyed You By [employer] Your Drinking Or Drug Use?: No Have You Felt Bad Or Guilty About Your Drinking Or Drug Use?: No Have You Ever Had a Drink or Used Drugs First Thing In The Morning to Steady Your Nerves or to Get Rid of a Hangover?: No CAGE-AID Score: 0  Substance Abuse Education Offered: Yes  Substance abuse interventions: Transport planner

## 2021-05-26 NOTE — Plan of Care (Signed)
  Problem: Education: Goal: Knowledge of disease or condition will improve Outcome: Progressing Goal: Knowledge of secondary prevention will improve (SELECT ALL) Outcome: Progressing Goal: Knowledge of patient specific risk factors will improve (INDIVIDUALIZE FOR PATIENT) Outcome: Progressing   Problem: Coping: Goal: Will verbalize positive feelings about self Outcome: Progressing Goal: Will identify appropriate support needs Outcome: Progressing   Problem: Health Behavior/Discharge Planning: Goal: Ability to manage health-related needs will improve Outcome: Progressing   Problem: Self-Care: Goal: Ability to participate in self-care as condition permits will improve Outcome: Progressing Goal: Verbalization of feelings and concerns over difficulty with self-care will improve Outcome: Progressing Goal: Ability to communicate needs accurately will improve Outcome: Progressing   Problem: Nutrition: Goal: Risk of aspiration will decrease Outcome: Progressing Goal: Dietary intake will improve Outcome: Progressing   Problem: Intracerebral Hemorrhage Tissue Perfusion: Goal: Complications of Intracerebral Hemorrhage will be minimized Outcome: Progressing   Problem: Ischemic Stroke/TIA Tissue Perfusion: Goal: Complications of ischemic stroke/TIA will be minimized Outcome: Progressing   Problem: Spontaneous Subarachnoid Hemorrhage Tissue Perfusion: Goal: Complications of Spontaneous Subarachnoid Hemorrhage will be minimized Outcome: Progressing   

## 2021-05-27 ENCOUNTER — Other Ambulatory Visit: Payer: Self-pay | Admitting: Cardiology

## 2021-05-27 ENCOUNTER — Inpatient Hospital Stay (HOSPITAL_COMMUNITY): Payer: Self-pay

## 2021-05-27 ENCOUNTER — Other Ambulatory Visit: Payer: Self-pay | Admitting: Internal Medicine

## 2021-05-27 ENCOUNTER — Encounter: Payer: Self-pay | Admitting: *Deleted

## 2021-05-27 ENCOUNTER — Encounter (HOSPITAL_COMMUNITY)
Admission: EM | Disposition: A | Discharge status: Home / Self Care | Payer: Self-pay | Source: Home / Self Care | Attending: Internal Medicine

## 2021-05-27 ENCOUNTER — Inpatient Hospital Stay (HOSPITAL_COMMUNITY): Payer: Self-pay | Admitting: Certified Registered Nurse Anesthetist

## 2021-05-27 ENCOUNTER — Other Ambulatory Visit (HOSPITAL_COMMUNITY): Payer: Self-pay

## 2021-05-27 ENCOUNTER — Encounter (HOSPITAL_COMMUNITY): Payer: Self-pay | Admitting: Internal Medicine

## 2021-05-27 DIAGNOSIS — I639 Cerebral infarction, unspecified: Secondary | ICD-10-CM

## 2021-05-27 DIAGNOSIS — I6300 Cerebral infarction due to thrombosis of unspecified precerebral artery: Secondary | ICD-10-CM

## 2021-05-27 DIAGNOSIS — R079 Chest pain, unspecified: Secondary | ICD-10-CM

## 2021-05-27 DIAGNOSIS — R2990 Unspecified symptoms and signs involving the nervous system: Secondary | ICD-10-CM

## 2021-05-27 HISTORY — PX: BUBBLE STUDY: SHX6837

## 2021-05-27 HISTORY — PX: TEE WITHOUT CARDIOVERSION: SHX5443

## 2021-05-27 LAB — PROTEIN S ACTIVITY: Protein S Activity: 106 % (ref 63–140)

## 2021-05-27 LAB — BASIC METABOLIC PANEL
Anion gap: 5 (ref 5–15)
BUN: 12 mg/dL (ref 6–20)
CO2: 25 mmol/L (ref 22–32)
Calcium: 8.7 mg/dL — ABNORMAL LOW (ref 8.9–10.3)
Chloride: 108 mmol/L (ref 98–111)
Creatinine, Ser: 1.14 mg/dL (ref 0.61–1.24)
GFR, Estimated: >60 mL/min (ref >60)
Glucose, Bld: 102 mg/dL — ABNORMAL HIGH (ref 70–99)
Potassium: 3.4 mmol/L — ABNORMAL LOW (ref 3.5–5.1)
Sodium: 138 mmol/L (ref 135–145)

## 2021-05-27 LAB — CBC
HCT: 38.1 % — ABNORMAL LOW (ref 39.0–52.0)
Hemoglobin: 12.8 g/dL — ABNORMAL LOW (ref 13.0–17.0)
MCH: 28.9 pg (ref 26.0–34.0)
MCHC: 33.6 g/dL (ref 30.0–36.0)
MCV: 86 fL (ref 80.0–100.0)
Platelets: 321 10*3/uL (ref 150–400)
RBC: 4.43 MIL/uL (ref 4.22–5.81)
RDW: 13.2 % (ref 11.5–15.5)
WBC: 8.1 10*3/uL (ref 4.0–10.5)
nRBC: 0 % (ref 0.0–0.2)

## 2021-05-27 LAB — BETA-2-GLYCOPROTEIN I ABS, IGG/M/A
Beta-2 Glyco I IgG: <9 GPI IgG units (ref 0–20)
Beta-2-Glycoprotein I IgA: <9 GPI IgA units (ref 0–25)
Beta-2-Glycoprotein I IgM: <9 GPI IgM units (ref 0–32)

## 2021-05-27 LAB — ECHO TEE
AV Mean grad: 2 mmHg
AV Peak grad: 3.2 mmHg
Ao pk vel: 0.9 m/s

## 2021-05-27 LAB — PROTEIN C ACTIVITY: Protein C Activity: 106 % (ref 73–180)

## 2021-05-27 LAB — PROTEIN S, TOTAL: Protein S Ag, Total: 123 % (ref 60–150)

## 2021-05-27 LAB — CARDIOLIPIN ANTIBODIES, IGG, IGM, IGA
Anticardiolipin IgA: <9 APL U/mL (ref 0–11)
Anticardiolipin IgG: <9 GPL U/mL (ref 0–14)
Anticardiolipin IgM: 9 MPL U/mL (ref 0–12)

## 2021-05-27 LAB — HOMOCYSTEINE: Homocysteine: 13.2 umol/L (ref 0.0–14.5)

## 2021-05-27 SURGERY — ECHOCARDIOGRAM, TRANSESOPHAGEAL
Anesthesia: Monitor Anesthesia Care

## 2021-05-27 MED ORDER — CLOPIDOGREL BISULFATE 75 MG PO TABS
75.0000 mg | ORAL_TABLET | Freq: Every day | ORAL | 0 refills | Status: AC
  Filled 2021-05-27: qty 18, 18d supply, fill #0

## 2021-05-27 MED ORDER — ASPIRIN 81 MG PO TBEC
81.0000 mg | DELAYED_RELEASE_TABLET | Freq: Every day | ORAL | 11 refills | Status: AC
  Filled 2021-05-27: qty 30, 30d supply, fill #0

## 2021-05-27 MED ORDER — ROSUVASTATIN CALCIUM 20 MG PO TABS
20.0000 mg | ORAL_TABLET | Freq: Every day | ORAL | 0 refills | Status: AC
  Filled 2021-05-27: qty 60, 60d supply, fill #0

## 2021-05-27 MED ORDER — SODIUM CHLORIDE 0.9 % IV SOLN: INTRAVENOUS | Status: DC

## 2021-05-27 MED ORDER — AMLODIPINE BESYLATE 5 MG PO TABS
5.0000 mg | ORAL_TABLET | Freq: Every day | ORAL | Status: DC
  Administered 2021-05-27: 12:00:00 5 mg via ORAL
  Filled 2021-05-27: qty 1

## 2021-05-27 MED ORDER — AMLODIPINE BESYLATE 5 MG PO TABS
5.0000 mg | ORAL_TABLET | Freq: Every day | ORAL | 0 refills | Status: AC
  Filled 2021-05-27: qty 30, 30d supply, fill #0

## 2021-05-27 MED ORDER — LIDOCAINE 2% (20 MG/ML) 5 ML SYRINGE
INTRAMUSCULAR | Status: DC | PRN
  Administered 2021-05-27: 100 mg via INTRAVENOUS

## 2021-05-27 MED ORDER — PROPOFOL 500 MG/50ML IV EMUL
INTRAVENOUS | Status: DC | PRN
  Administered 2021-05-27: 150 ug/kg/min via INTRAVENOUS

## 2021-05-27 MED ORDER — PROPOFOL 10 MG/ML IV BOLUS
INTRAVENOUS | Status: DC | PRN
  Administered 2021-05-27: 10 mg via INTRAVENOUS
  Administered 2021-05-27: 30 mg via INTRAVENOUS

## 2021-05-27 NOTE — Discharge Summary (Signed)
Name: Drew Waller MRN: [id number] DOB: 03/01/1961 60 y.o. PCP: Pcp, No  Date of Admission: 05/25/2021  9:30 AM Date of Discharge: 05/27/2021 Attending Physician: Angelica Pou, MD  Discharge Diagnosis: 1. 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 Hypertension 3. Hyperlipidemia 4. Normocytic anemia   Discharge Medications: Allergies as of 05/27/2021   No Known Allergies      Medication List     TAKE these medications    amLODipine 5 MG tablet Commonly known as: NORVASC Take 1 tablet (5 mg total) by mouth daily. Start taking on: May 28, 2021   aspirin 81 MG EC tablet Take 1 tablet (81 mg total) by mouth daily. Swallow whole. Start taking on: May 28, 2021   clopidogrel 75 MG tablet Commonly known as: PLAVIX Take 1 tablet (75 mg total) by mouth daily for 18 days.   rosuvastatin 20 MG tablet Commonly known as: CRESTOR Take 1 tablet (20 mg total) by mouth daily. Start taking on: May 28, 2021        Disposition and follow-up:   Mr.Daud Mijares was discharged from [facility] in Stable condition.  At the hospital follow up visit please address:  1.  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 Titrate prn  Hyperlipidemia: - Ensure taking crestor and monitor for any side effects   Normocytic anemia, chronic: - Will need follow up iron studies and age appropriate cancer screening   2.  Labs / imaging needed at time of follow-up: CBC, BMP  3.  Pending labs/ test needing follow-up: Transcranial doppler with  bubble, Protein C/S labs, Lupus anticoagulant, homocysteine, Factor 5 leiden, Prothrombin gene, cardiolipin antibodies, beta2glycoprotein antibodies   Follow-up Appointments:  Follow-up [facility] up on 07/11/2021.   Why: Your appointment is at 2:30 pm. Please arrive early and bring a picture ID and your current medications. 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 Given his young age, Neuro has elected to obtain hypercoagulable and vasculitic labs, as well as TEE, TCD, and DVT study. Lipids with LDL of 99, normal A1C. Echo without structural or valvular disease and no interatrial shunt. TEE with LVEF 45% with negative bubble study. Patient without any residual deficits on discharge. Recommended for follow up with [facility] to establish care and to obtain transcranial doppler US and DVT US with neurology to complete work up. Cardiac monitor on discharge. Recommended for DAPT x3 weeks followed by aspirin alone, statin therapy and antihypertensives.   2. Hypertension SBP 140-160s during admission. Patient started on amlodipine 5mg  daily.   3.  Hyperlipidemia Total cholesterol 173, LDL 99. Started on statin therapy.   4. Normocytic anemia, chronic Appears to be chronic on labs. Will need iron studies and age appropriate cancer screening at follow up.   Discharge Exam:   BP (!) 147/94 (BP Location: Left Arm)    Pulse 79    Temp 98.1 F (36.7 C) (Oral)    Resp 17     Ht 5\' 6"  (1.676 m)    Wt 81.6 kg    SpO2 100%    BMI 29.05 kg/m  Discharge exam:  Neuro exam is repeated from the previous day: Vitals reviewed.  Cardiovascular:     Rate and Rhythm: Normal rate and regular rhythm.     Pulses: Normal pulses.     Heart sounds: No murmur heard. Pulmonary:     Effort: Pulmonary effort is normal.     Breath sounds: Normal breath sounds.  Abdominal:     General: Bowel sounds are normal.     Palpations: Abdomen is soft.     Tenderness: There is no abdominal tenderness.  Neurological:     Mental Status: He is fully alert and oriented.     Comments: 5/5 strength in UE and LE Sensation intact in UE and LE No dysmetria on finger to nose EOM intact CN intact     Pertinent Labs, Studies, and Procedures:  CBC Latest Ref Rng & Units 05/27/2021 05/25/2021 05/25/2021  WBC 4.0 - 10.5 K/uL 8.1 - 8.5  Hemoglobin 13.0 - 17.0 g/dL 12.8(L) 12.2(L) 12.1(L)  Hematocrit 39.0 - 52.0 % 38.1(L) 36.0(L) 37.0(L)  Platelets 150 - 400 K/uL 321 - 313   CMP Latest Ref Rng & Units 05/27/2021 05/25/2021 05/25/2021  Glucose 70 - 99 mg/dL 102(H) 83 88  BUN 6 - 20 mg/dL 12 11 11   Creatinine 0.61 - 1.24 mg/dL 1.14 1.00 1.01  Sodium 135 - 145 mmol/L 138 144 139  Potassium 3.5 - 5.1 mmol/L 3.4(L) 3.6 3.7  Chloride 98 - 111 mmol/L 108 110 110  CO2 22 - 32 mmol/L 25 - 19(L)  Calcium 8.9 - 10.3 mg/dL 8.7(L) - 8.4(L)  Total Protein 6.5 - 8.1 g/dL - - 6.3(L)  Total Bilirubin 0.3 - 1.2 mg/dL - - 0.3  Alkaline Phos 38 - 126 U/L - - 75  AST 15 - 41 U/L - - 29  ALT 0 - 44 U/L - - 21   Lipid Panel     Component Value Date/Time   CHOL 173 05/26/2021 0151   TRIG 80 05/26/2021 0151   HDL 58 05/26/2021 0151   CHOLHDL 3.0 05/26/2021 0151   VLDL 16 05/26/2021 0151   LDLCALC 99 05/26/2021 0151   Hgb A1c MFr Bld 4.8 - 5.6 % 5.5    CT HEAD WO CONTRAST 05/25/2021 IMPRESSION: 1. Normal for age non contrast CT appearance of the brain. 2. Acute bilateral paranasal sinusitis.  MRI  BRAIN WO CONTRAST 05/25/2021 IMPRESSION: Small acute infarction in the superior cerebellum on the right. Punctate acute infarction in the left superolateral thalamus. 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 Mild to moderate chronic small-vessel ischemic changes elsewhere within the cerebral hemispheric white matter.  MRA HEAD NECK WO CONTRAST 05/25/2021:  Normal MR angiography of the neck and intracranial vessels. The vertebral artery origins are not included on the study, but there is no secondary sign of flow limiting stenosis affecting either vertebral artery.   Discharge Instructions: Discharge Instructions     Call MD for:  difficulty breathing,  headache or visual disturbances   Complete by: As directed    Call MD for:  extreme fatigue   Complete by: As directed    Call MD for:  persistant dizziness or light-headedness   Complete by: As directed    Call MD for:  persistant nausea and vomiting   Complete by: As directed    Diet - low sodium heart healthy   Complete by: As directed    Discharge instructions   Complete by: As directed    Mr Ike Maragh,  You were admitted for left sided weakness and found to have an acute stroke. On discharge, please continue to take medications as prescribed:  - Aspirin 81mg  daily  - Plavix 75mg  daily for 21 days total  - Amlodipine 5mg  daily for blood pressure  - Rosuvastatin 20mg  daily for cholesterol  Follow up testing: You will be called to schedule your transcranial doppler. Please schedule this at your earliest convenience.   Please call to schedule a hospital follow up appointment in the Internal Medicine Clinic at your earliest convenience. Please schedule an appointment with [facility] within 4 weeks.   Thank you!   Increase activity slowly   Complete by: As directed        Signed: , MD PGY-1

## 2021-05-27 NOTE — Progress Notes (Signed)
°  Echocardiogram Echocardiogram Transesophageal has been performed.  Janalyn Harder 05/27/2021, 8:25 AM

## 2021-05-27 NOTE — Progress Notes (Signed)
Cardiology asked for cardiac monitor in the setting of CVA. PCP cardiologist Dr. Mayford Knife, will arrange outpatient follow up appt.

## 2021-05-27 NOTE — Anesthesia Preprocedure Evaluation (Signed)
Anesthesia Evaluation  Patient identified by MRN, date of birth, ID band Patient awake    Reviewed: Allergy & Precautions, H&P , NPO status , Patient's Chart, lab work & pertinent test results  Airway Mallampati: II   Neck ROM: full    Dental   Pulmonary neg pulmonary ROS,    breath sounds clear to auscultation       Cardiovascular negative cardio ROS   Rhythm:regular Rate:Normal     Neuro/Psych CVA    GI/Hepatic   Endo/Other    Renal/GU      Musculoskeletal   Abdominal   Peds  Hematology   Anesthesia Other Findings   Reproductive/Obstetrics                             Anesthesia Physical Anesthesia Plan  ASA: 2  Anesthesia Plan: MAC   Post-op Pain Management:    Induction: Intravenous  PONV Risk Score and Plan: 1 and Propofol infusion and Treatment may vary due to age or medical condition  Airway Management Planned: Nasal Cannula  Additional Equipment:   Intra-op Plan:   Post-operative Plan:   Informed Consent: I have reviewed the patients History and Physical, chart, labs and discussed the procedure including the risks, benefits and alternatives for the proposed anesthesia with the patient or authorized representative who has indicated his/her understanding and acceptance.     Dental advisory given  Plan Discussed with: CRNA, Anesthesiologist and Surgeon  Anesthesia Plan Comments:         Anesthesia Quick Evaluation

## 2021-05-27 NOTE — Transfer of Care (Signed)
Immediate Anesthesia Transfer of Care Note  Patient: Drew Waller  Procedure(s) Performed: TRANSESOPHAGEAL ECHOCARDIOGRAM (TEE) BUBBLE STUDY  Patient Location: Endoscopy Unit  Anesthesia Type:MAC  Level of Consciousness: drowsy  Airway & Oxygen Therapy: Patient Spontanous Breathing  Post-op Assessment: Report given to RN and Post -op Vital signs reviewed and stable  Post vital signs: Reviewed and stable  Last Vitals:  Vitals Value Taken Time  BP 146/112 05/27/21 0822  Temp    Pulse 98 05/27/21 0822  Resp 16 05/27/21 0822  SpO2 100 % 05/27/21 0822  Vitals shown include unvalidated device data.  Last Pain:  Vitals:   05/27/21 0714  TempSrc: Tympanic  PainSc:          Complications: No notable events documented.

## 2021-05-27 NOTE — Interval H&P Note (Signed)
History and Physical Interval Note:  05/27/2021 7:47 AM  Drew Waller  has presented today for surgery, with the diagnosis of STROKE.  The various methods of treatment have been discussed with the patient and family. After consideration of risks, benefits and other options for treatment, the patient has consented to  Procedure(s): TRANSESOPHAGEAL ECHOCARDIOGRAM (TEE) (N/A) as a surgical intervention.  The patient's history has been reviewed, patient examined, no change in status, stable for surgery.  I have reviewed the patient's chart and labs.  Questions were answered to the patient's satisfaction.     Cyndi Montejano A Jahni Nazar

## 2021-05-27 NOTE — Anesthesia Procedure Notes (Signed)
Procedure Name: MAC Date/Time: 05/27/2021 7:56 AM Performed by: Dorthea Cove, CRNA Pre-anesthesia Checklist: Patient identified, Emergency Drugs available, Suction available, Patient being monitored and Timeout performed Patient Re-evaluated:Patient Re-evaluated prior to induction Oxygen Delivery Method: Nasal cannula Preoxygenation: Pre-oxygenation with 100% oxygen Induction Type: IV induction Placement Confirmation: CO2 detector and positive ETCO2 Dental Injury: Teeth and Oropharynx as per pre-operative assessment

## 2021-05-27 NOTE — Progress Notes (Signed)
STROKE TEAM PROGRESS NOTE   INTERVAL HISTORY Doing well. No changes. On aspirin and plavix. TEE done today and neg.  Vitals:   05/27/21 0835 05/27/21 0900 05/27/21 0929 05/27/21 1355  BP: (!) 159/99 140/61 (!) 162/93 (!) 147/94  Pulse: 83 99 63 79  Resp: 17 (!) 21 16 17   Temp:   97.6 F (36.4 C) 98.1 F (36.7 C)  TempSrc:   Axillary Oral  SpO2: 100% 98% 100% 100%  Weight:      Height:       CBC:  Recent Labs  Lab 05/25/21 0939 05/25/21 1004 05/27/21 0154  WBC 8.5  --  8.1  NEUTROABS 5.7  --   --   HGB 12.1* 12.2* 12.8*  HCT 37.0* 36.0* 38.1*  MCV 89.6  --  86.0  PLT 313  --  321   Basic Metabolic Panel:  Recent Labs  Lab 05/25/21 0939 05/25/21 1004 05/27/21 0154  NA 139 144 138  K 3.7 3.6 3.4*  CL 110 110 108  CO2 19*  --  25  GLUCOSE 88 83 102*  BUN 11 11 12   CREATININE 1.01 1.00 1.14  CALCIUM 8.4*  --  8.7*   Lipid Panel:  Recent Labs  Lab 05/26/21 0151  CHOL 173  TRIG 80  HDL 58  CHOLHDL 3.0  VLDL 16  LDLCALC 99   HgbA1c:  Recent Labs  Lab 05/26/21 0151  HGBA1C 5.5   Urine Drug Screen:  Recent Labs  Lab 05/26/21 1938  LABOPIA NONE DETECTED  COCAINSCRNUR POSITIVE*  LABBENZ NONE DETECTED  AMPHETMU NONE DETECTED  THCU POSITIVE*  LABBARB NONE DETECTED    Alcohol Level No results for input(s): ETH in the last 168 hours.  IMAGING past 24 hours ECHO TEE  Result Date: 05/27/2021    TRANSESOPHOGEAL ECHO REPORT   Patient Name:   Drew Waller Date of Exam: 05/27/2021 Medical Rec #:  Drew Waller        Height:       66.0 in Accession #:    05/29/2021       Weight:       180.0 lb Date of Birth:  [birth year]        BSA:          1.912 m Patient Age:    60 years         BP:           121/84 mmHg Patient Gender: M                HR:           102 bpm. Exam Location:  Inpatient Procedure: 3D Echo, Transesophageal Echo, Cardiac Doppler, Color Doppler and            Saline Contrast Bubble Study Indications:     Stroke  History:         Patient has  prior history of Echocardiogram examinations, most                  recent 05/22/2021. Abnormal ECG, Stroke; Signs/Symptoms:Chest                  Pain.  Sonographer:     12/01/1960 RDCS Referring Phys:  [id number] [place] Diagnosing Phys: Sheralyn Boatman MD PROCEDURE: After discussion of the risks and benefits of a TEE, an informed consent was obtained. The transesophogeal probe was passed without difficulty through the esophogus of the patient. Imaged were obtained with the  patient in a left lateral decubitus position. Sedation performed by different physician. The patient was monitored while under deep sedation. Anesthestetic sedation was provided intravenously by Anesthesiology: 300mg  of Propofol, 100mg  of Lidocaine. The patient's vital signs; including heart rate, blood pressure, and oxygen saturation; remained stable throughout the procedure. The patient developed no complications during the procedure. IMPRESSIONS  1. Left ventricular ejection fraction, by estimation, is 45 to 50%. The left ventricle has mildly decreased function. The left ventricle demonstrates global hypokinesis.  2. Right ventricular systolic function is normal. The right ventricular size is normal.  3. No left atrial/left atrial appendage thrombus was detected.  4. The mitral valve is abnormal- there is a very smal echodensity seen in images 43 and 44- cannot exclude small torn chord. There is no associated valve dysfunction. Trivial mitral valve regurgitation. No evidence of mitral stenosis.  5. The aortic valve is tricuspid. Aortic valve regurgitation is not visualized. No aortic stenosis is present. There is a very small (subcentimeter) echodensity on the aortic side of the valve consistent with a Lambl's excresence.  6. Agitated saline contrast bubble study was negative, with no evidence of any interatrial shunt. FINDINGS  Left Ventricle: Left ventricular ejection fraction, by estimation, is 45 to 50%. The left ventricle has mildly  decreased function. The left ventricle demonstrates global hypokinesis. The left ventricular internal cavity size was normal in size. Right Ventricle: The right ventricular size is normal. Right vetricular wall thickness was not assessed. Right ventricular systolic function is normal. Left Atrium: Left atrial size was normal in size. No left atrial/left atrial appendage thrombus was detected. Right Atrium: Right atrial size was normal in size. Pericardium: There is no evidence of pericardial effusion. Mitral Valve: The mitral valve is abnormal. Trivial mitral valve regurgitation. No evidence of mitral valve stenosis. Tricuspid Valve: The tricuspid valve is normal in structure. Tricuspid valve regurgitation is not demonstrated. No evidence of tricuspid stenosis. Aortic Valve: The aortic valve is tricuspid. Aortic valve regurgitation is not visualized. No aortic stenosis is present. Aortic valve mean gradient measures 2.0 mmHg. Aortic valve peak gradient measures 3.2 mmHg. Pulmonic Valve: The pulmonic valve was normal in structure. Pulmonic valve regurgitation is not visualized. Aorta: The aortic root, ascending aorta, aortic arch and descending aorta are all structurally normal, with no evidence of dilitation or obstruction. IAS/Shunts: The interatrial septum is aneurysmal. No atrial level shunt detected by color flow Doppler. Agitated saline contrast was given intravenously to evaluate for intracardiac shunting. Agitated saline contrast bubble study was negative, with no evidence of any interatrial shunt.   3D Volume EF LV 3D EDV:   104.53 ml LV 3D ESV:   57.13 ml  3D Volume EF: 3D EF:        45 % AORTIC VALVE AV Vmax:      89.70 cm/s AV Vmean:     60.600 cm/s AV VTI:       0.149 m AV Peak Grad: 3.2 mmHg AV Mean Grad: 2.0 mmHg MD Electronically signed by MD Signature Date/Time: 05/27/2021/8:46:59 AM    Final     PHYSICAL EXAM Gen: NAD HEENT: Head is nontraumatic. Neck is  supple  Resp: non labored.  Neurological Exam ;  Awake  Alert oriented x 3. Normal speech and language.eye movements full without nystagmus.fundi were not visualized. Vision acuity and fields appear normal. Hearing is normal.  Face symmetric. Tongue midline. Motor: Normal strength. Cerebellar: normal.coordination.  Sensation: normal. Gait deferred.  ASSESSMENT/PLAN Mr. Drew Waller  is a 60 y.o. male with history of unknown respiratory issue presenting with left-side numbness and difficulty walking that lasted approximately 15-20 minutes.  Imaging of his brain shows a small acute infarction in the superior cerebellum on the right and a punctate acute infarction in the left superolateral thalamus.  He is scheduled to get a TEE tomorrow and consider loop recorder at discharge.  Hypercoagulability panel is pending as well as a urine drug screen.  Physical therapy recommends no follow-up.  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  MRI small acute infarction in the superior cerebellum on the right.  Punctate acute infarction in the left superolateral thalamus.  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  Normal left ventricular function no regional wall abnormalities.  No atrial shunt detected TCD bubble study-scheduled 05/26/2021. ? results Venous duplex-pending TEE-done today. Neg. Hypercoag labs: neg so far. Urine Tox + for cocaine/THC LDL 99 HgbA1c 5.5 VTE prophylaxis - SCDs    Diet   Diet Heart Room service appropriate? Yes; Fluid consistency: Thin   No antithrombotic prior to admission, now on aspirin 81 mg daily and clopidogrel 75 mg daily.  For 3 weeks  followed by aspirin alone Therapy recommendations: No PT follow-up recommended Disposition: Pending  Hypertension Home meds: None Stable Permissive hypertension (OK if < 220/120) but gradually normalize in 5-7 days Long-term BP goal normotensive  Hyperlipidemia Home meds: None LDL 99, goal < 70 Add Crestor 20 mg High intensity statin not indicated  Continue statin at discharge  Other Stroke Risk Factors Advanced Age >/= 20  Cigarette smoker, advised to stop smoking Substance abuse - UDS: pending Obesity, Body mass index is 29.05 kg/m., BMI >/= 30 associated with increased stroke risk, recommend weight loss, diet and exercise as appropriate    Hospital day # 1    Total of 30 mins spent reviewing chart, discussion with patient and family on prognosis, Dx and plan. Discussed case with patient's nurse. Reviewed Imaging personally.   05/27/2021 5:00 PM   To contact Stroke Continuity provider, please refer to [email or web address]. After hours, contact General Neurology

## 2021-05-27 NOTE — CV Procedure (Signed)
° ° °  TRANSESOPHAGEAL ECHOCARDIOGRAM   NAME:  Drew Waller    MRN: [id number] DOB:  May 27, 1961    ADMIT DATE: 05/25/2021  INDICATIONS: Stroke  PROCEDURE:   Informed consent was obtained prior to the procedure. The risks, benefits and alternatives for the procedure were discussed and the patient comprehended these risks.  Risks include, but are not limited to, cough, sore throat, vomiting, nausea, somnolence, esophageal and stomach trauma or perforation, bleeding, low blood pressure, aspiration, pneumonia, infection, trauma to the teeth and death.    Procedural time out performed. The oropharynx was anesthetized with topical 1% benzocaine.    Anesthesia was administered by Dr. Chaney Malling and team.  The patient was administered a total of Propofol 300 mg, Lidocaine 100 mg to achieve and maintain moderate to deep conscious sedation.  The patient's heart rate, blood pressure, and oxygen saturation are monitored continuously during the procedure. The period of conscious sedation is 15 minutes, of which I was present face-to-face 100% of this time.   The transesophageal probe was inserted in the esophagus and stomach without difficulty and multiple views were obtained.   COMPLICATIONS:    There were no immediate complications.  KEY FINDINGS:  Mildly reduced LVEF 45% Negative bubble study.  Full report to follow. Further management per primary team.   Riley Lam, MD [place]   [facility]  8:18 AM

## 2021-05-27 NOTE — TOC Transition Note (Signed)
Transition of Care [facility]) - CM/SW Discharge Note   Patient Details  Name: Drew Waller MRN: [id number] Date of Birth: [birth year]  Transition of Care [facility]) CM/SW Contact:  Kermit Balo, RN Phone Number: 05/27/2021, 1:55 PM   Clinical Narrative:    Patient is discharging home with self care. No f/u per PT/OT and no DME needs.  PCP arranged with [facility] and information on the AVS. Medications for home to be delivered to the room per [facility] pharmacy.  Pt has transport home.    Final next level of care: Home/Self Care Barriers to Discharge: Inadequate or no insurance, Barriers Unresolved (comment)   Patient Goals and CMS Choice     Choice offered to / list presented to : Patient  Discharge Placement                       Discharge Plan and Services   Discharge Planning Services: CM Consult                                 Social Determinants of Health (SDOH) Interventions     Readmission Risk Interventions No flowsheet data found.

## 2021-05-27 NOTE — Progress Notes (Signed)
Patient ID: Drew Waller, male   DOB: January 20, 1961, 60 y.o.   MRN: [id number] Patient was enrolled for [employer] to ship a 30 day cardiac event monitor to his home. Letter with instructions and self pay discount information mailed to patient.

## 2021-05-29 NOTE — Anesthesia Postprocedure Evaluation (Signed)
Anesthesia Post Note  Patient: Drew Waller  Procedure(s) Performed: TRANSESOPHAGEAL ECHOCARDIOGRAM (TEE) BUBBLE STUDY     Patient location during evaluation: Endoscopy Anesthesia Type: MAC Level of consciousness: awake and alert Pain management: pain level controlled Vital Signs Assessment: post-procedure vital signs reviewed and stable Respiratory status: spontaneous breathing, nonlabored ventilation, respiratory function stable and patient connected to nasal cannula oxygen Cardiovascular status: stable and blood pressure returned to baseline Postop Assessment: no apparent nausea or vomiting Anesthetic complications: no   No notable events documented.  Last Vitals:  Vitals:   05/27/21 0929 05/27/21 1355  BP: (!) 162/93 (!) 147/94  Pulse: 63 79  Resp: 16 17  Temp: 36.4 C 36.7 C  SpO2: 100% 100%    Last Pain:  Vitals:   05/27/21 1355  TempSrc: Oral  PainSc:                  [place]

## 2021-05-30 ENCOUNTER — Encounter (HOSPITAL_COMMUNITY): Payer: Self-pay | Admitting: Internal Medicine

## 2021-05-30 LAB — LUPUS ANTICOAGULANT PANEL
DRVVT: 34.9 s (ref 0.0–47.0)
PTT Lupus Anticoagulant: 29.7 s (ref 0.0–51.9)

## 2021-05-31 LAB — PROTEIN C, TOTAL: Protein C, Total: 92 % (ref 60–150)

## 2021-06-01 LAB — FACTOR 5 LEIDEN

## 2021-06-02 LAB — PROTHROMBIN GENE MUTATION

## 2021-06-07 ENCOUNTER — Ambulatory Visit (HOSPITAL_COMMUNITY): Admission: RE | Admit: 2021-06-07 | Payer: Self-pay | Source: Ambulatory Visit

## 2021-06-07 ENCOUNTER — Ambulatory Visit (HOSPITAL_COMMUNITY): Payer: Self-pay | Attending: Internal Medicine

## 2021-06-09 ENCOUNTER — Telehealth (HOSPITAL_COMMUNITY): Payer: Self-pay | Admitting: Pharmacist

## 2021-06-09 ENCOUNTER — Other Ambulatory Visit (HOSPITAL_COMMUNITY): Payer: Self-pay

## 2021-06-09 NOTE — Telephone Encounter (Signed)
Transitions of Care Pharmacy   Call attempted for a pharmacy transitions of care follow-up.  Voicemail not available.   Call attempt #1. Will follow-up in 1-3 days.

## 2021-06-13 ENCOUNTER — Telehealth (HOSPITAL_COMMUNITY): Payer: Self-pay | Admitting: Pharmacist

## 2021-06-13 ENCOUNTER — Other Ambulatory Visit (HOSPITAL_COMMUNITY): Payer: Self-pay

## 2021-06-13 NOTE — Telephone Encounter (Signed)
Pharmacy Transitions of Care Follow-up Telephone Call  Date of discharge: 05/27/21  Discharge Diagnosis: stroke   Medication changes made at discharge:  - START:  amLODipine (NORVASC)  Aspirin Low Dose (aspirin)  clopidogrel (PLAVIX)  rosuvastatin (CRESTOR)   - STOPPED: n/a  - CHANGED: n/a  Medication changes verified by the patient? Yes    Medication Accessibility:  Home Pharmacy: [facility]  Was the patient provided with refills on discharged medications? Only amlodipine (partial fill remains) and ASA   Have all prescriptions been transferred from [facility] to home pharmacy?  N/a - pt will get refills from PCP  Is the patient able to afford medications? Now has ins as of 2023 Notable copays: all generic    Medication Review:  CLOPIDOGREL (PLAVIX) Clopidogrel 75 mg once daily.  - Educated patient on expected duration of therapy of ASA with clopidogrel of 3 weeks. Then continue aspirin daily.  - Reviewed potential DDIs with patient  - Advised patient of medications to avoid (NSAIDs, ASA)  - Educated that Tylenol (acetaminophen) will be the preferred analgesic to prevent risk of bleeding  - Emphasized importance of monitoring for signs and symptoms of bleeding (abnormal bruising, prolonged bleeding, nose bleeds, bleeding from gums, discolored urine, black tarry stools)  - Advised patient to alert all providers of anticoagulation therapy prior to starting a new medication or having a procedure    Follow-up Appointments:  PCP Hospital f/u appt confirmed?  [facility] appt on 07/11/21   Specialist Hospital f/u appt confirmed?  Scheduled to see Armanda Magic, cardiology on 07/19/21.  Pt plans to ask PCP about referral to neurology for follow-up of stroke.  If their condition worsens, is the pt aware to call PCP or go to the Emergency Dept.? yes  Final Patient Assessment: Pt is doing well, reports taking all four new meds daily, with only 1 missed dose in the past 2 weeks.  No issues with  bleeding/bruising or other adverse events. Pt was appreciative of the call.

## 2021-07-04 ENCOUNTER — Ambulatory Visit (HOSPITAL_COMMUNITY)
Admission: RE | Admit: 2021-07-04 | Discharge: 2021-07-04 | Disposition: A | Discharge status: Home / Self Care | Payer: BLUE CROSS/BLUE SHIELD | Source: Ambulatory Visit | Attending: Internal Medicine | Admitting: Internal Medicine

## 2021-07-04 ENCOUNTER — Other Ambulatory Visit (HOSPITAL_COMMUNITY): Payer: Self-pay

## 2021-07-04 ENCOUNTER — Other Ambulatory Visit: Payer: Self-pay

## 2021-07-04 ENCOUNTER — Encounter (HOSPITAL_COMMUNITY): Payer: Self-pay

## 2021-07-04 ENCOUNTER — Ambulatory Visit (HOSPITAL_BASED_OUTPATIENT_CLINIC_OR_DEPARTMENT_OTHER)
Admission: RE | Admit: 2021-07-04 | Discharge: 2021-07-04 | Disposition: A | Discharge status: Home / Self Care | Payer: BLUE CROSS/BLUE SHIELD | Source: Ambulatory Visit | Attending: Internal Medicine | Admitting: Internal Medicine

## 2021-07-04 DIAGNOSIS — I6300 Cerebral infarction due to thrombosis of unspecified precerebral artery: Secondary | ICD-10-CM | POA: Diagnosis present

## 2021-07-04 NOTE — Progress Notes (Signed)
TCD bubble study and lower extremity venous has been completed.   Preliminary results in CV Proc.   Aundra Millet Sherlene Rickel 07/04/2021 2:51 PM

## 2021-07-19 ENCOUNTER — Ambulatory Visit: Payer: BLUE CROSS/BLUE SHIELD | Admitting: Cardiology

## 2021-07-25 ENCOUNTER — Inpatient Hospital Stay: Payer: Self-pay | Admitting: Neurology

## 2021-10-04 ENCOUNTER — Encounter: Payer: Self-pay | Admitting: Neurology

## 2021-10-04 ENCOUNTER — Inpatient Hospital Stay: Payer: Self-pay | Admitting: Neurology

## 2022-08-24 IMAGING — MR MR HEAD W/O CM
6 of 11 series · 24 of 48 positions shown · non-contrast
Comparison: Head CT same day

CLINICAL DATA: Transient ischemic attack. Left-sided weakness and
slurred speech.



[Series 5: DWI · axial · 3.0mm · 0.94mm/px · z∈[-89,+61]mm · 7 of 99 slices shown (1 of 2)]
[im 1/99]
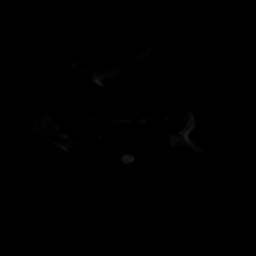
[im 17/99]
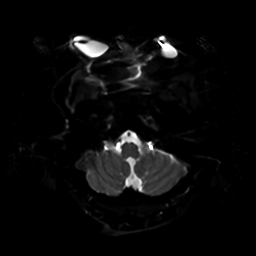
[im 33/99]
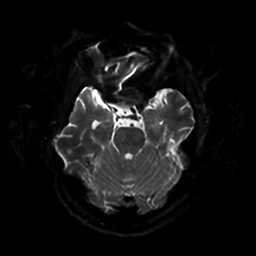
[im 50/99]
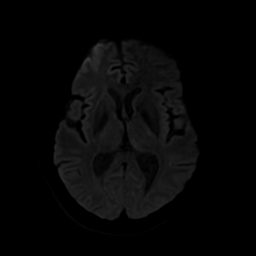
[im 66/99]
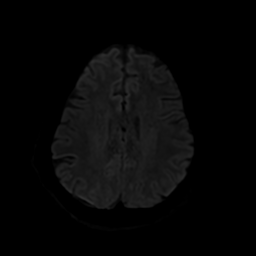
[im 82/99]
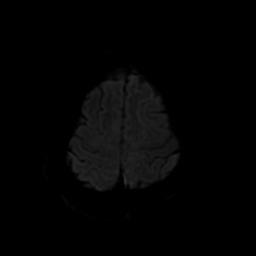
[im 99/99]
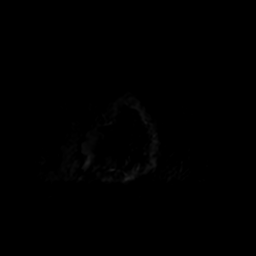

[Series 6: DWI · coronal · 4.0mm · 0.94mm/px · 5 of 74 slices shown (2 of 2)]
[im 1/74]
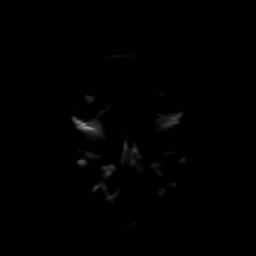
[im 19/74]
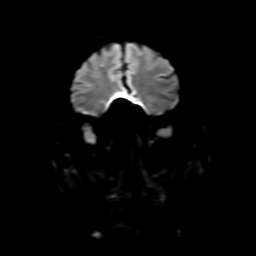
[im 37/74]
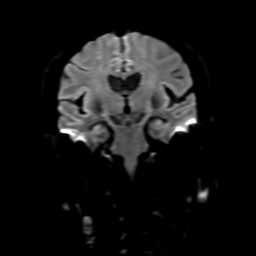
[im 55/74]
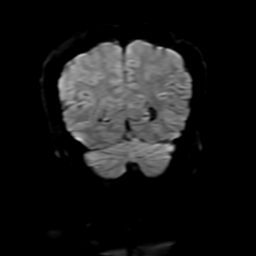
[im 74/74]
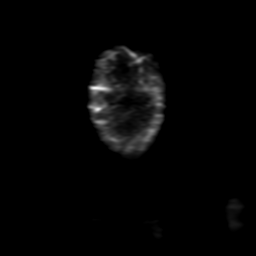

[Series 7: FLAIR · sagittal · 5.0mm · 0.23mm/px · 2 of 23 slices shown (1 of 2)]
[im 1/23]
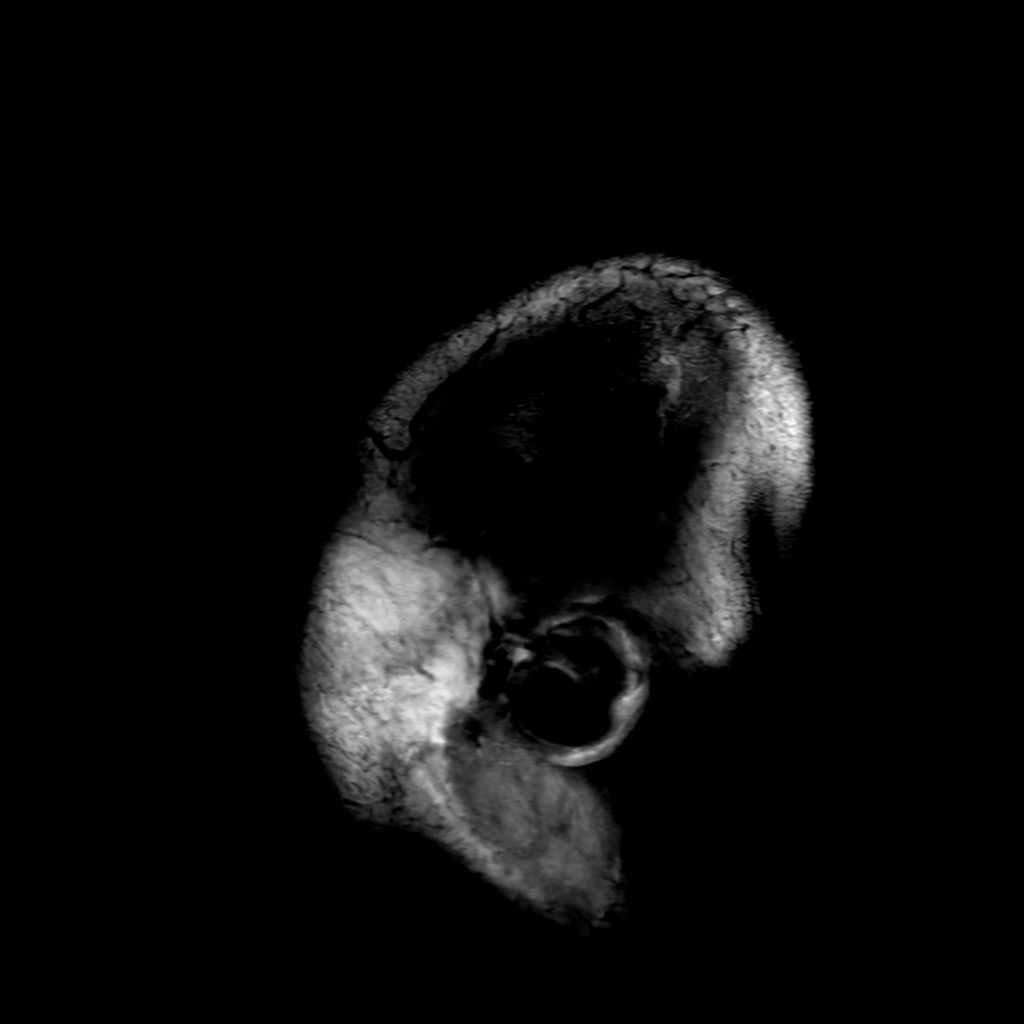
[im 23/23]
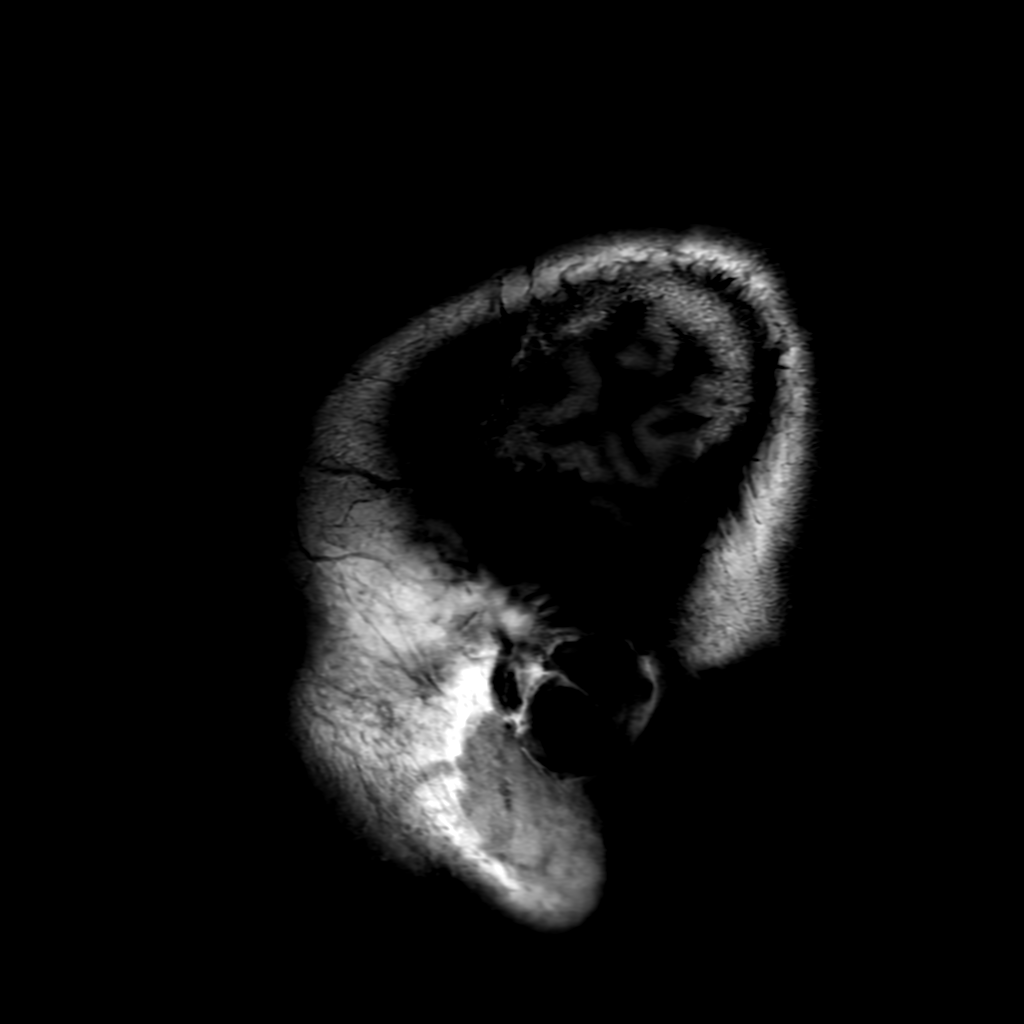

[Series 9: FLAIR · axial · 4.0mm · 0.47mm/px · z∈[-112,+35]mm · 3 of 35 slices shown (2 of 2)]
[im 1/35]
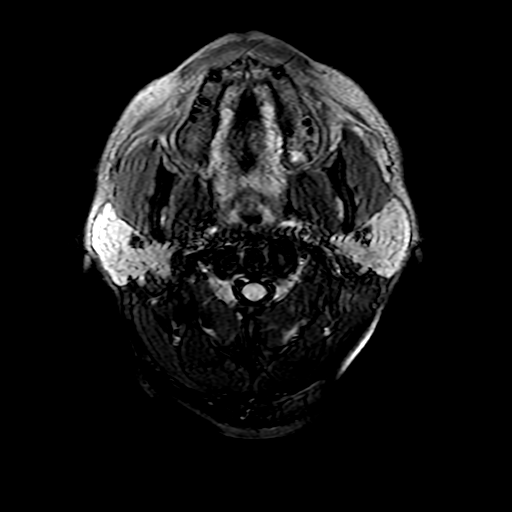
[im 18/35]
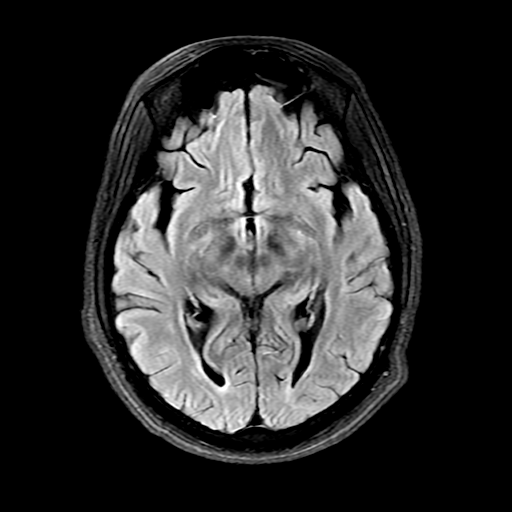
[im 35/35]
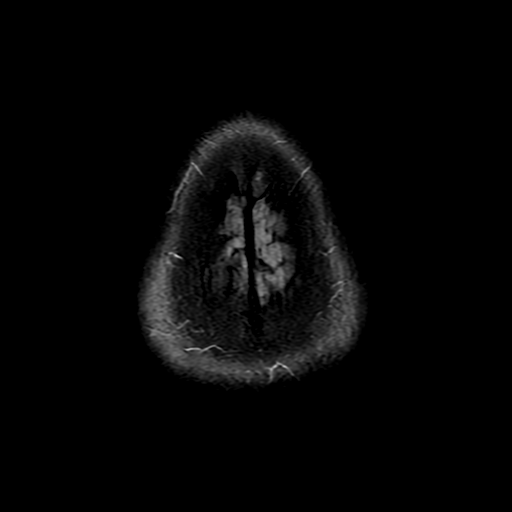

[Series 550: ADC · axial · 3.0mm · 0.94mm/px · z∈[-89,+61]mm · 4 of 51 slices shown (1 of 2)]
[im 1/51]
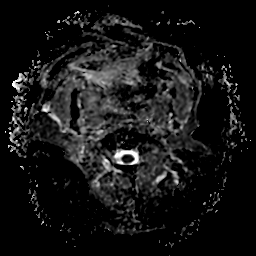
[im 17/51]
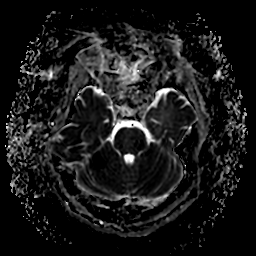
[im 34/51]
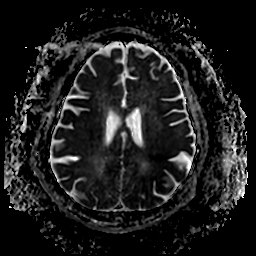
[im 51/51]
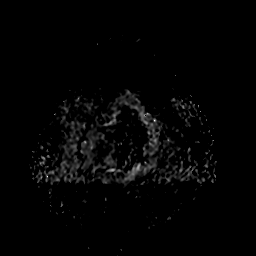

[Series 650: ADC · coronal · 4.0mm · 0.94mm/px · 3 of 35 slices shown (2 of 2)]
[im 1/35]
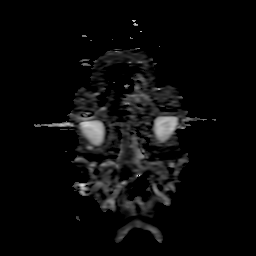
[im 18/35]
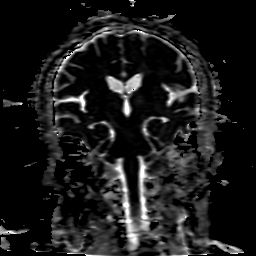
[im 35/35]
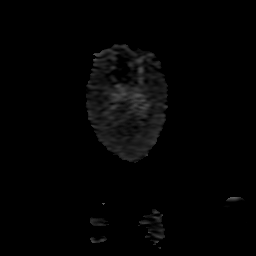

[24 of 48 positions shown; findings below may reference images not displayed]

FINDINGS: MRI HEAD FINDINGS

Brain: Diffusion imaging shows a small acute infarction in the
superior cerebellum on the right. Punctate acute infarction in the
left superolateral thalamus. No other acute finding. The brainstem
and cerebellum are otherwise normal. Cerebral hemispheres show mild
to moderate chronic small-vessel ischemic change of the white
matter. No cortical or large vessel territory infarction. No mass
lesion, hemorrhage, hydrocephalus or extra-axial collection.

Vascular: Major vessels at the base of the brain show flow.

Skull and upper cervical spine: Negative

Sinuses/Orbits: Mucosal inflammatory changes of the paranasal
sinuses. Orbits negative.

Other: None

MRA HEAD FINDINGS

Both internal carotid arteries are widely patent into the brain. No
siphon stenosis. The anterior and middle cerebral vessels are patent
without proximal stenosis, aneurysm or vascular malformation.

Both vertebral arteries are widely patent to the basilar. No basilar
stenosis. Posterior circulation branch vessels appear normal.

MRA NECK FINDINGS

Both common carotid arteries widely patent to the bifurcation. Both
carotid bifurcations are normal. Both cervical internal carotid
arteries are normal.

Vertebral artery origins are not included on the examination. Beyond
the origins, both vessels are widely patent through the neck,
through the foramen magnum to the basilar. The vertebral arteries
are proximally equal in size.
IMPRESSION: Small acute infarction in the superior cerebellum on the right.

Punctate acute infarction in the left superolateral thalamus.
Findings suggest micro embolic disease in the posterior circulation.

Mild to moderate chronic small-vessel ischemic changes elsewhere
within the cerebral hemispheric white matter.

Normal MR angiography of the neck and intracranial vessels. The
vertebral artery origins are not included on the study, but there is
no secondary sign of flow limiting stenosis affecting either
vertebral artery.
